# Patient Record
Sex: Female | Born: 1990 | Race: White | Hispanic: No | Marital: Married | Smoking: Never smoker
Health system: Southern US, Community
[De-identification: ages and names within clinical notes are randomized; demographics above are authoritative.]

## PROBLEM LIST (undated history)

## (undated) DIAGNOSIS — G43909 Migraine, unspecified, not intractable, without status migrainosus: Secondary | ICD-10-CM

## (undated) HISTORY — PX: TONSILLECTOMY: SUR1361

---
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
## 2013-11-06 DIAGNOSIS — G43809 Other migraine, not intractable, without status migrainosus: Secondary | ICD-10-CM | POA: Insufficient documentation

## 2018-07-03 DIAGNOSIS — H81393 Other peripheral vertigo, bilateral: Secondary | ICD-10-CM | POA: Insufficient documentation

## 2018-07-03 DIAGNOSIS — H6993 Unspecified Eustachian tube disorder, bilateral: Secondary | ICD-10-CM | POA: Insufficient documentation

## 2018-12-08 DIAGNOSIS — E66811 Obesity, class 1: Secondary | ICD-10-CM | POA: Insufficient documentation

## 2018-12-08 DIAGNOSIS — E6609 Other obesity due to excess calories: Secondary | ICD-10-CM | POA: Insufficient documentation

## 2018-12-08 DIAGNOSIS — H8113 Benign paroxysmal vertigo, bilateral: Secondary | ICD-10-CM | POA: Insufficient documentation

## 2019-07-12 ENCOUNTER — Ambulatory Visit: Payer: Self-pay | Attending: Internal Medicine

## 2019-07-12 DIAGNOSIS — Z23 Encounter for immunization: Secondary | ICD-10-CM

## 2019-07-12 NOTE — Progress Notes (Signed)
   Covid-19 Vaccination Clinic  Name:  Nadira Ambrosino    MRN: [id number] DOB: 1990-10-21  07/12/2019  Ms. Sheniqua Kohnen was observed post Covid-19 immunization for 15 minutes without incident. She was provided with Vaccine Information Sheet and instruction to access the V-Safe system.   Ms. Deshana Wiemer was instructed to call 911 with any severe reactions post vaccine: Marland Kitchen Difficulty breathing  . Swelling of face and throat  . A fast heartbeat  . A bad rash all over body  . Dizziness and weakness   Immunizations Administered    Name Date Dose VIS Date Route   Pfizer COVID-19 Vaccine 07/12/2019  8:44 AM 0.3 mL 04/06/2019 Intramuscular   Manufacturer: [place]   Lot: [id number]   [place]: [id number]

## 2019-08-07 ENCOUNTER — Ambulatory Visit: Payer: Self-pay | Attending: Internal Medicine

## 2019-08-07 DIAGNOSIS — Z23 Encounter for immunization: Secondary | ICD-10-CM

## 2019-08-07 NOTE — Progress Notes (Signed)
   Covid-19 Vaccination Clinic  Name:  Haely Hartness    MRN: [id number] DOB: Mar 31, 1991  08/07/2019  Ms. Jarrell Manzanares was observed post Covid-19 immunization for 15 minutes without incident. She was provided with Vaccine Information Sheet and instruction to access the V-Safe system.   Ms. Cora Phaup was instructed to call 911 with any severe reactions post vaccine: Marland Kitchen Difficulty breathing  . Swelling of face and throat  . A fast heartbeat  . A bad rash all over body  . Dizziness and weakness   Immunizations Administered    Name Date Dose VIS Date Route   Pfizer COVID-19 Vaccine 08/07/2019  8:29 AM 0.3 mL 04/06/2019 Intramuscular   Manufacturer: [place]   Lot: [phone] [id number]   [place]: [id number]

## 2020-09-24 DIAGNOSIS — K219 Gastro-esophageal reflux disease without esophagitis: Secondary | ICD-10-CM | POA: Insufficient documentation

## 2021-02-17 ENCOUNTER — Ambulatory Visit (INDEPENDENT_AMBULATORY_CARE_PROVIDER_SITE_OTHER)

## 2021-02-17 ENCOUNTER — Other Ambulatory Visit: Payer: Self-pay

## 2021-02-17 ENCOUNTER — Encounter: Payer: Self-pay | Admitting: Emergency Medicine

## 2021-02-17 ENCOUNTER — Ambulatory Visit
Admission: EM | Admit: 2021-02-17 | Discharge: 2021-02-17 | Disposition: A | Discharge status: Home / Self Care | Attending: Emergency Medicine | Admitting: Emergency Medicine

## 2021-02-17 DIAGNOSIS — J060 Acute laryngopharyngitis: Secondary | ICD-10-CM

## 2021-02-17 DIAGNOSIS — J029 Acute pharyngitis, unspecified: Secondary | ICD-10-CM | POA: Diagnosis not present

## 2021-02-17 HISTORY — DX: Migraine, unspecified, not intractable, without status migrainosus: G43.909

## 2021-02-17 LAB — POCT RAPID STREP A (OFFICE): Rapid Strep A Screen: NEGATIVE

## 2021-02-17 MED ORDER — PANTOPRAZOLE SODIUM 20 MG PO TBEC: 20.0000 mg | DELAYED_RELEASE_TABLET | Freq: Every day | ORAL | 0 refills | Status: DC

## 2021-02-17 MED ORDER — FLUTICASONE PROPIONATE 50 MCG/ACT NA SUSP: 2.0000 | Freq: Every day | NASAL | 0 refills | Status: DC

## 2021-02-17 MED ORDER — FAMOTIDINE 20 MG PO TABS: 20.0000 mg | ORAL_TABLET | Freq: Two times a day (BID) | ORAL | 0 refills | Status: DC

## 2021-02-17 NOTE — Discharge Instructions (Addendum)
Obvious postnasal drip.  I will contact you only if your x-ray is abnormal.  You can also call here and get the results in about an hour.  If it is abnormal, you will need to go to the emergency department.  In the meantime, Flonase for  Pepcid, Protonix for GERD.  Make sure you drink plenty of extra fluids.  Some people find salt water gargles and  Tr[employer] Medic[employer]'s "Throat Coat" tea helpful. Take 5 mL of liquid Benadryl and 5 mL of Maalox. Mix it together, and then hold it in your mouth for as long as you can and then swallow. You may do this 4 times a day.    Go to www.goodrx.com  or www.costplusdrugs.com to look up your medications. This will give you a list of where you can find your prescriptions at the most affordable prices. Or ask the pharmacist what the cash price is, or if they have any other discount programs available to help make your medication more affordable. This can be less expensive than what you would pay with insurance.

## 2021-02-17 NOTE — ED Triage Notes (Signed)
Home covid test negative.  Sore throat since last night.  States she does feel some drainage going down her throat.

## 2021-02-17 NOTE — ED Provider Notes (Signed)
HPI  SUBJECTIVE:  Patient reports severe upper sore throat starting last night.  Sx worse with breathing, swallowing, talking.  She has tried cough drops, tea, soup and Vicks.  The cough drops and tea are no longer helping.  She had a URI last week with a mild sore throat, most of the symptoms resolved except for the sore throat and laryngitis. No fever   No neck stiffness  No Cough No nasal congestion, rhinorrhea + Postnasal drip No Myalgias No new or different headache No Rash  No loss of taste or smell No shortness of breath or difficulty breathing No nausea, vomiting No diarrhea No abdominal pain     + reflux sxs-has been having issues with her GERD for the past several months, however this has not changed recently No Allergy sxs  + Raspy, but not muffled voice No Breathing difficulty, sensation of throat swelling shut No Drooling No Trismus No abx in past month.  No antipyretic in past 4-6 hrs  Past medical history of GERD, migraines, status post tonsillectomy. LMP: 10/14 denies the possibility being pregnant. [id number], Hinda Kehr    Past Medical History:  Diagnosis Date   Migraine     History reviewed. No pertinent surgical history.  History reviewed. No pertinent family history.  Social History   Tobacco Use   Smoking status: Never   Smokeless tobacco: Never  Vaping Use   Vaping Use: Never used  Substance Use Topics   Alcohol use: Not Currently   Drug use: Never    No current facility-administered medications for this encounter.  Current Outpatient Medications:    amitriptyline (ELAVIL) 10 MG tablet, Take 10 mg by mouth at bedtime., Disp: , Rfl:    famotidine (PEPCID) 20 MG tablet, Take 1 tablet (20 mg total) by mouth 2 (two) times daily., Disp: 40 tablet, Rfl: 0   fluticasone (FLONASE) 50 MCG/ACT nasal spray, Place 2 sprays into both nostrils daily., Disp: 16 g, Rfl: 0   pantoprazole (PROTONIX) 20 MG tablet, Take 1 tablet (20 mg total) by  mouth daily., Disp: 30 tablet, Rfl: 0   spironolactone (ALDACTONE) 25 MG tablet, Take 25 mg by mouth daily., Disp: , Rfl:    verapamil (CALAN) 80 MG tablet, Take 80 mg by mouth once., Disp: , Rfl:   No Known Allergies   ROS  As noted in HPI.   Physical Exam  BP 120/80 (BP Location: Right Arm)   Pulse 84   Temp 98.8 F (37.1 C) (Oral)   Resp 18   LMP 02/06/2021 (Exact Date)   SpO2 98%   Constitutional: Well developed, well nourished, no acute distress Eyes:  EOMI, conjunctiva normal bilaterally HENT: Normocephalic, atraumatic,mucus membranes moist. no nasal congestion +  erythematous oropharynx.  Tonsils surgically absent. Uvula midline. No postnasal drip, no drooling, trismus.  Raspy voice Respiratory: Normal inspiratory effort Cardiovascular: Normal rate, no murmurs, rubs, gallops GI: nondistended, nontender. No appreciable splenomegaly skin: No rash, skin intact Lymph: -  Anterior cervical LN.  No posterior cervical lymphadenopathy Musculoskeletal: no deformities Neurologic: Alert & oriented x 3, no focal neuro deficits Psychiatric: Speech and behavior appropriate.   ED Course   Medications - No data to display  Orders Placed This Encounter  Procedures   DG Neck Soft Tissue    Standing Status:   Standing    Number of Occurrences:   1    Order Specific Question:   Reason for Exam (SYMPTOM  OR DIAGNOSIS REQUIRED)    Answer:  Severe sore throat rule out epiglottitis   POCT rapid strep A    Standing Status:   Standing    Number of Occurrences:   1    Results for orders placed or performed during the hospital encounter of 02/17/21 (from the past 24 hour(s))  POCT rapid strep A     Status: None   Collection Time: 02/17/21 10:16 AM  Result Value Ref Range   Rapid Strep A Screen Negative Negative   DG Neck Soft Tissue  Result Date: 02/17/2021 CLINICAL DATA:  Severe sore throat question epiglottitis EXAM: NECK SOFT TISSUES - 1+ VIEW COMPARISON:  None FINDINGS:  Prevertebral soft tissues normal thickness. Epiglottis and aryepiglottic folds normal appearance. No regional soft tissue swelling identified. Visualized airway patent and lung apices clear. Osseous structures unremarkable. IMPRESSION: Normal exam. Electronically Signed   By: Lavonia Dana M.D.   On: 02/17/2021 11:28    ED Clinical Impression  1. Sore throat and laryngitis      ED Assessment/Plan   Rapid strep negative.  Checking soft tissue neck because recent URI and given the severity of the sore throat.  will contact patient at [phone] if abnormal, and I will instruct her to go to the ED.  In the meantime, Flonase, Benadryl/Maalox, will also start treating her GERD in case this is contributing to her sore throat.  Pepcid and Protonix.  No NSAIDs/Tylenol because she gets frequent migraines that occur with frequent medication use.  Reviewed imaging independently.  No evidence of epiglottitis.  Normal.  See radiology report for details.   Patient to followup with PMD when necessary.  Discussed imaging, MDM, plan and followup with patient. Discussed sn/sx that should prompt return to the ED. patient agrees with plan.   Meds ordered this encounter  Medications   famotidine (PEPCID) 20 MG tablet    Sig: Take 1 tablet (20 mg total) by mouth 2 (two) times daily.    Dispense:  40 tablet    Refill:  0   pantoprazole (PROTONIX) 20 MG tablet    Sig: Take 1 tablet (20 mg total) by mouth daily.    Dispense:  30 tablet    Refill:  0   fluticasone (FLONASE) 50 MCG/ACT nasal spray    Sig: Place 2 sprays into both nostrils daily.    Dispense:  16 g    Refill:  0     *This clinic note was created using [employer]. Therefore, there may be occasional mistakes despite careful proofreading.     Melynda Ripple, MD 02/18/21 [phone]

## 2021-03-18 ENCOUNTER — Ambulatory Visit: Payer: Self-pay

## 2021-06-18 ENCOUNTER — Ambulatory Visit
Admission: RE | Admit: 2021-06-18 | Discharge: 2021-06-18 | Disposition: A | Discharge status: Home / Self Care | Source: Ambulatory Visit | Attending: Urgent Care | Admitting: Urgent Care

## 2021-06-18 ENCOUNTER — Other Ambulatory Visit: Payer: Self-pay

## 2021-06-18 VITALS — BP 132/77 | HR 88 | Temp 98.3°F | Resp 18

## 2021-06-18 DIAGNOSIS — R5383 Other fatigue: Secondary | ICD-10-CM | POA: Diagnosis present

## 2021-06-18 DIAGNOSIS — R300 Dysuria: Secondary | ICD-10-CM | POA: Diagnosis present

## 2021-06-18 DIAGNOSIS — Z87440 Personal history of urinary (tract) infections: Secondary | ICD-10-CM | POA: Diagnosis present

## 2021-06-18 DIAGNOSIS — R5381 Other malaise: Secondary | ICD-10-CM

## 2021-06-18 DIAGNOSIS — R102 Pelvic and perineal pain: Secondary | ICD-10-CM

## 2021-06-18 DIAGNOSIS — D259 Leiomyoma of uterus, unspecified: Secondary | ICD-10-CM | POA: Diagnosis present

## 2021-06-18 LAB — POCT URINALYSIS DIP (MANUAL ENTRY)
Bilirubin, UA: NEGATIVE
Blood, UA: NEGATIVE
Glucose, UA: NEGATIVE mg/dL
Ketones, POC UA: NEGATIVE mg/dL
Leukocytes, UA: NEGATIVE
Nitrite, UA: NEGATIVE
Protein Ur, POC: NEGATIVE mg/dL
Spec Grav, UA: 1.01 (ref 1.010–1.025)
Urobilinogen, UA: 0.2 E.U./dL
pH, UA: 6 (ref 5.0–8.0)

## 2021-06-18 LAB — POCT URINE PREGNANCY: Preg Test, Ur: NEGATIVE

## 2021-06-18 MED ORDER — PHENAZOPYRIDINE HCL 200 MG PO TABS: 200.0000 mg | ORAL_TABLET | Freq: Three times a day (TID) | ORAL | 0 refills | Status: DC | PRN

## 2021-06-18 NOTE — ED Provider Notes (Signed)
[place]   MRN: [id number] DOB: October 24, 1990  Subjective:   Ashlee Grant is a 31 y.o. female presenting for 1 day history of malaise and fatigue, started having dysuria today. Took an urinalysis test otc and was positive for an UTI. Has had recurrent UTIs in the past year, has had ~3 in 1 year. Hydrates very well, has some coffee. Generally does not over do it but has had 4 shots of espresso today.  Has not seen a urologist.  She does have a gynecologist.  Reports that she has had a history of fibroids.  Denies fever, nausea, vomiting, urinary frequency, hematuria, vaginal discharge, genital rash.  Patient is in a monogamous relationship, low suspicion for STI but is not opposed to testing.  No current facility-administered medications for this encounter.  Current Outpatient Medications:    amitriptyline (ELAVIL) 10 MG tablet, Take 10 mg by mouth at bedtime., Disp: , Rfl:    famotidine (PEPCID) 20 MG tablet, Take 1 tablet (20 mg total) by mouth 2 (two) times daily., Disp: 40 tablet, Rfl: 0   fluticasone (FLONASE) 50 MCG/ACT nasal spray, Place 2 sprays into both nostrils daily., Disp: 16 g, Rfl: 0   pantoprazole (PROTONIX) 20 MG tablet, Take 1 tablet (20 mg total) by mouth daily., Disp: 30 tablet, Rfl: 0   spironolactone (ALDACTONE) 25 MG tablet, Take 25 mg by mouth daily., Disp: , Rfl:    verapamil (CALAN) 80 MG tablet, Take 80 mg by mouth once., Disp: , Rfl:    Allergies  Allergen Reactions   Effexor [Venlafaxine]     Past Medical History:  Diagnosis Date   Migraine      History reviewed. No pertinent surgical history.  History reviewed. No pertinent family history.  Social History   Tobacco Use   Smoking status: Never   Smokeless tobacco: Never  Vaping Use   Vaping Use: Never used  Substance Use Topics   Alcohol use: Not Currently   Drug use: Never    ROS   Objective:   Vitals: BP 132/77 (BP Location: Right Arm)    Pulse 88    Temp  98.3 F (36.8 C) (Oral)    Resp 18    LMP 05/30/2021 (Exact Date)    SpO2 98%   Physical Exam Constitutional:      General: She is not in acute distress.    Appearance: Normal appearance. She is well-developed. She is not ill-appearing, toxic-appearing or diaphoretic.  HENT:     Head: Normocephalic and atraumatic.     Nose: Nose normal.     Mouth/Throat:     Mouth: Mucous membranes are moist.  Eyes:     General: No scleral icterus.       Right eye: No discharge.        Left eye: No discharge.     Extraocular Movements: Extraocular movements intact.     Conjunctiva/sclera: Conjunctivae normal.  Cardiovascular:     Rate and Rhythm: Normal rate.  Pulmonary:     Effort: Pulmonary effort is normal.  Abdominal:     General: Bowel sounds are normal. There is no distension.     Palpations: Abdomen is soft. There is no mass.     Tenderness: There is abdominal tenderness (right pelvic). There is no right CVA tenderness, left CVA tenderness, guarding or rebound.  Skin:    General: Skin is warm and dry.  Neurological:     General: No focal deficit present.  Mental Status: She is alert and oriented to person, place, and time.  Psychiatric:        Mood and Affect: Mood normal.        Behavior: Behavior normal.        Thought Content: Thought content normal.        Judgment: Judgment normal.    Results for orders placed or performed during the hospital encounter of 06/18/21 (from the past 24 hour(s))  POCT urinalysis dipstick     Status: None   Collection Time: 06/18/21  5:13 PM  Result Value Ref Range   Color, UA yellow yellow   Clarity, UA clear clear   Glucose, UA negative negative mg/dL   Bilirubin, UA negative negative   Ketones, POC UA negative negative mg/dL   Spec Grav, UA 1.010 1.010 - 1.025   Blood, UA negative negative   pH, UA 6.0 5.0 - 8.0   Protein Ur, POC negative negative mg/dL   Urobilinogen, UA 0.2 0.2 or 1.0 E.U./dL   Nitrite, UA Negative Negative    Leukocytes, UA Negative Negative  POCT urine pregnancy     Status: None   Collection Time: 06/18/21  5:13 PM  Result Value Ref Range   Preg Test, Ur Negative Negative    Assessment and Plan :   PDMP not reviewed this encounter.  1. Dysuria   2. Malaise and fatigue   3. History of UTI   4. Uterine leiomyoma, unspecified location    Recommended aggressive hydration, limiting urinary irritants.  Urine culture pending, use supportive care including Pyridium.  Discussed the possibility that her pelvic pain may be related to the fibroid but patient prefers to avoid any particular pain treatments for now.  Plans on following up with her gynecologist.  For good measure, we are doing an STI check.  Low suspicion for PID, pyelonephritis, recurrent UTI, appendicitis.  Follow-up with PCP, consider referral to urology.  Counseled patient on potential for adverse effects with medications prescribed/recommended today, ER and return-to-clinic precautions discussed, patient verbalized understanding.    Jaynee Eagles, [place] 06/18/21 1736

## 2021-06-18 NOTE — Discharge Instructions (Addendum)
Make sure you hydrate very well with plain water and a quantity of 64 ounces of water a day.  Please limit drinks that are considered urinary irritants such as soda, sweet tea, coffee, energy drinks, alcohol.  These can worsen your urinary and genital symptoms but also be the source of them.  I will let you know about your urine culture and vaginal swab results through MyChart to see if we need to prescribe or change your antibiotics based off of those results.

## 2021-06-18 NOTE — ED Triage Notes (Signed)
Feels fatigued.  Pain on urination that started today.  Did at home urine test that tested positive.

## 2021-06-19 LAB — CERVICOVAGINAL ANCILLARY ONLY
Chlamydia: NEGATIVE
Comment: NEGATIVE
Comment: NEGATIVE
Comment: NORMAL
Neisseria Gonorrhea: NEGATIVE
Trichomonas: NEGATIVE

## 2021-06-20 LAB — URINE CULTURE: Culture: 70000 — AB

## 2021-06-25 ENCOUNTER — Telehealth (HOSPITAL_COMMUNITY): Payer: Self-pay | Admitting: Emergency Medicine

## 2021-06-25 MED ORDER — METRONIDAZOLE 500 MG PO TABS: 500.0000 mg | ORAL_TABLET | Freq: Two times a day (BID) | ORAL | 0 refills | Status: DC

## 2021-06-25 NOTE — Telephone Encounter (Signed)
Patient returned call and states she has noticed she is experiencing worsening urinary symptoms since her visit, reviewed previously with [facility], APP and okay'd for Metronidazole if symptomatic.  Metronidazole, per protocol, sent to pharmacy on file ?

## 2021-10-26 ENCOUNTER — Ambulatory Visit: Payer: Self-pay

## 2022-02-05 ENCOUNTER — Ambulatory Visit
Admission: RE | Admit: 2022-02-05 | Discharge: 2022-02-05 | Discharge status: Against Medical Advice | Payer: Self-pay | Source: Ambulatory Visit | Attending: Physician Assistant | Admitting: Physician Assistant

## 2022-02-05 NOTE — ED Notes (Signed)
Pt reports PCP had an opening for today and reported would rather be seen there. Pt left prior to triage.

## 2022-05-16 ENCOUNTER — Encounter: Payer: Self-pay | Admitting: Emergency Medicine

## 2022-05-16 ENCOUNTER — Ambulatory Visit
Admission: EM | Admit: 2022-05-16 | Discharge: 2022-05-16 | Disposition: A | Discharge status: Home / Self Care | Attending: Urgent Care | Admitting: Urgent Care

## 2022-05-16 DIAGNOSIS — Z23 Encounter for immunization: Secondary | ICD-10-CM

## 2022-05-16 DIAGNOSIS — S61211A Laceration without foreign body of left index finger without damage to nail, initial encounter: Secondary | ICD-10-CM | POA: Diagnosis not present

## 2022-05-16 DIAGNOSIS — M79645 Pain in left finger(s): Secondary | ICD-10-CM | POA: Diagnosis not present

## 2022-05-16 MED ORDER — TETANUS-DIPHTH-ACELL PERTUSSIS 5-2.5-18.5 LF-MCG/0.5 IM SUSY
0.5000 mL | PREFILLED_SYRINGE | Freq: Once | INTRAMUSCULAR | Status: AC
  Administered 2022-05-16: 0.5 mL via INTRAMUSCULAR

## 2022-05-16 NOTE — Discharge Instructions (Signed)

## 2022-05-16 NOTE — ED Triage Notes (Signed)
Cut left index finger while cutting bread this morning.  States she feels nauseated.  Unsure of when last tetanus shot was.  States she passed out after cutting her finger.

## 2022-05-16 NOTE — ED Provider Notes (Signed)
[facility]  Note:  This document was prepared using Dragon voice recognition software and may include unintentional dictation errors.  MRN: [id number] DOB: April 22, 1991  Subjective:   Ashlee Grant is a 32 y.o. female presenting for suffering a left index finger laceration while cutting bread this morning.  She came straight to our clinic.  Needs a Tdap update.  No loss of range of motion, sensation.  No current facility-administered medications for this encounter.  Current Outpatient Medications:    amitriptyline (ELAVIL) 10 MG tablet, Take 10 mg by mouth at bedtime., Disp: , Rfl:    famotidine (PEPCID) 20 MG tablet, Take 1 tablet (20 mg total) by mouth 2 (two) times daily., Disp: 40 tablet, Rfl: 0   fluticasone (FLONASE) 50 MCG/ACT nasal spray, Place 2 sprays into both nostrils daily., Disp: 16 g, Rfl: 0   metroNIDAZOLE (FLAGYL) 500 MG tablet, Take 1 tablet (500 mg total) by mouth 2 (two) times daily., Disp: 14 tablet, Rfl: 0   pantoprazole (PROTONIX) 20 MG tablet, Take 1 tablet (20 mg total) by mouth daily., Disp: 30 tablet, Rfl: 0   phenazopyridine (PYRIDIUM) 200 MG tablet, Take 1 tablet (200 mg total) by mouth 3 (three) times daily as needed for pain., Disp: 30 tablet, Rfl: 0   spironolactone (ALDACTONE) 25 MG tablet, Take 25 mg by mouth daily., Disp: , Rfl:    verapamil (CALAN) 80 MG tablet, Take 80 mg by mouth once., Disp: , Rfl:    Allergies  Allergen Reactions   Effexor [Venlafaxine]     Past Medical History:  Diagnosis Date   Migraine      Past Surgical History:  Procedure Laterality Date   TONSILLECTOMY      History reviewed. No pertinent family history.  Social History   Tobacco Use   Smoking status: Never   Smokeless tobacco: Never  Vaping Use   Vaping Use: Never used  Substance Use Topics   Alcohol use: Not Currently   Drug use: Never    ROS   Objective:   Vitals: BP (!) 91/53 (BP Location: Right Arm)   Pulse 73    Temp 98 F (36.7 C) (Oral)   Resp 18   LMP 05/02/2022 (Approximate)   SpO2 95%   Physical Exam Constitutional:      General: She is not in acute distress.    Appearance: Normal appearance. She is well-developed. She is not ill-appearing, toxic-appearing or diaphoretic.  HENT:     Head: Normocephalic and atraumatic.     Nose: Nose normal.     Mouth/Throat:     Mouth: Mucous membranes are moist.  Eyes:     General: No scleral icterus.       Right eye: No discharge.        Left eye: No discharge.     Extraocular Movements: Extraocular movements intact.  Cardiovascular:     Rate and Rhythm: Normal rate.  Pulmonary:     Effort: Pulmonary effort is normal.  Musculoskeletal:       Hands:  Skin:    General: Skin is warm and dry.  Neurological:     General: No focal deficit present.     Mental Status: She is alert and oriented to person, place, and time.  Psychiatric:        Mood and Affect: Mood normal.        Behavior: Behavior normal.     PROCEDURE NOTE: laceration repair Verbal consent obtained from patient.  Local anesthesia with 1cc Lidocaine 2% without epinephrine.  Wound explored for tendon, ligament damage. Wound scrubbed with soap and water and rinsed. Wound closed with #2 5-0 Ethilon (simple interrupted) sutures.  Wound cleansed and dressed.   Assessment and Plan :   PDMP not reviewed this encounter.  1. Finger pain, left   2. Laceration of left index finger without foreign body without damage to nail, initial encounter   3. Need for diphtheria-tetanus-pertussis (Tdap) vaccine     Laceration repaired successfully. Wound care reviewed. Recommended Tylenol and/or ibuprofen for pain control. Return-to-clinic precautions discussed, patient verbalized understanding. Otherwise, follow up in 10 days for suture removal. Counseled patient on potential for adverse effects with medications prescribed/recommended today, ER and return-to-clinic precautions discussed, patient  verbalized understanding.    Jaynee Eagles, [place] 05/16/22 [phone]

## 2022-05-26 ENCOUNTER — Ambulatory Visit
Admission: RE | Admit: 2022-05-26 | Discharge: 2022-05-26 | Disposition: A | Discharge status: Home / Self Care | Source: Ambulatory Visit | Attending: Physician Assistant | Admitting: Physician Assistant

## 2022-05-26 DIAGNOSIS — S61211A Laceration without foreign body of left index finger without damage to nail, initial encounter: Secondary | ICD-10-CM | POA: Diagnosis not present

## 2022-05-26 NOTE — ED Triage Notes (Signed)
Pt presents for suture removal in left index finger.

## 2023-03-07 IMAGING — DX DG NECK SOFT TISSUE
2 series · 2 of 2 positions shown · non-contrast
Comparison: None

CLINICAL DATA: Severe sore throat question epiglottitis

EXAM:
NECK SOFT TISSUES - 1+ VIEW

[soft tissue neck ap]
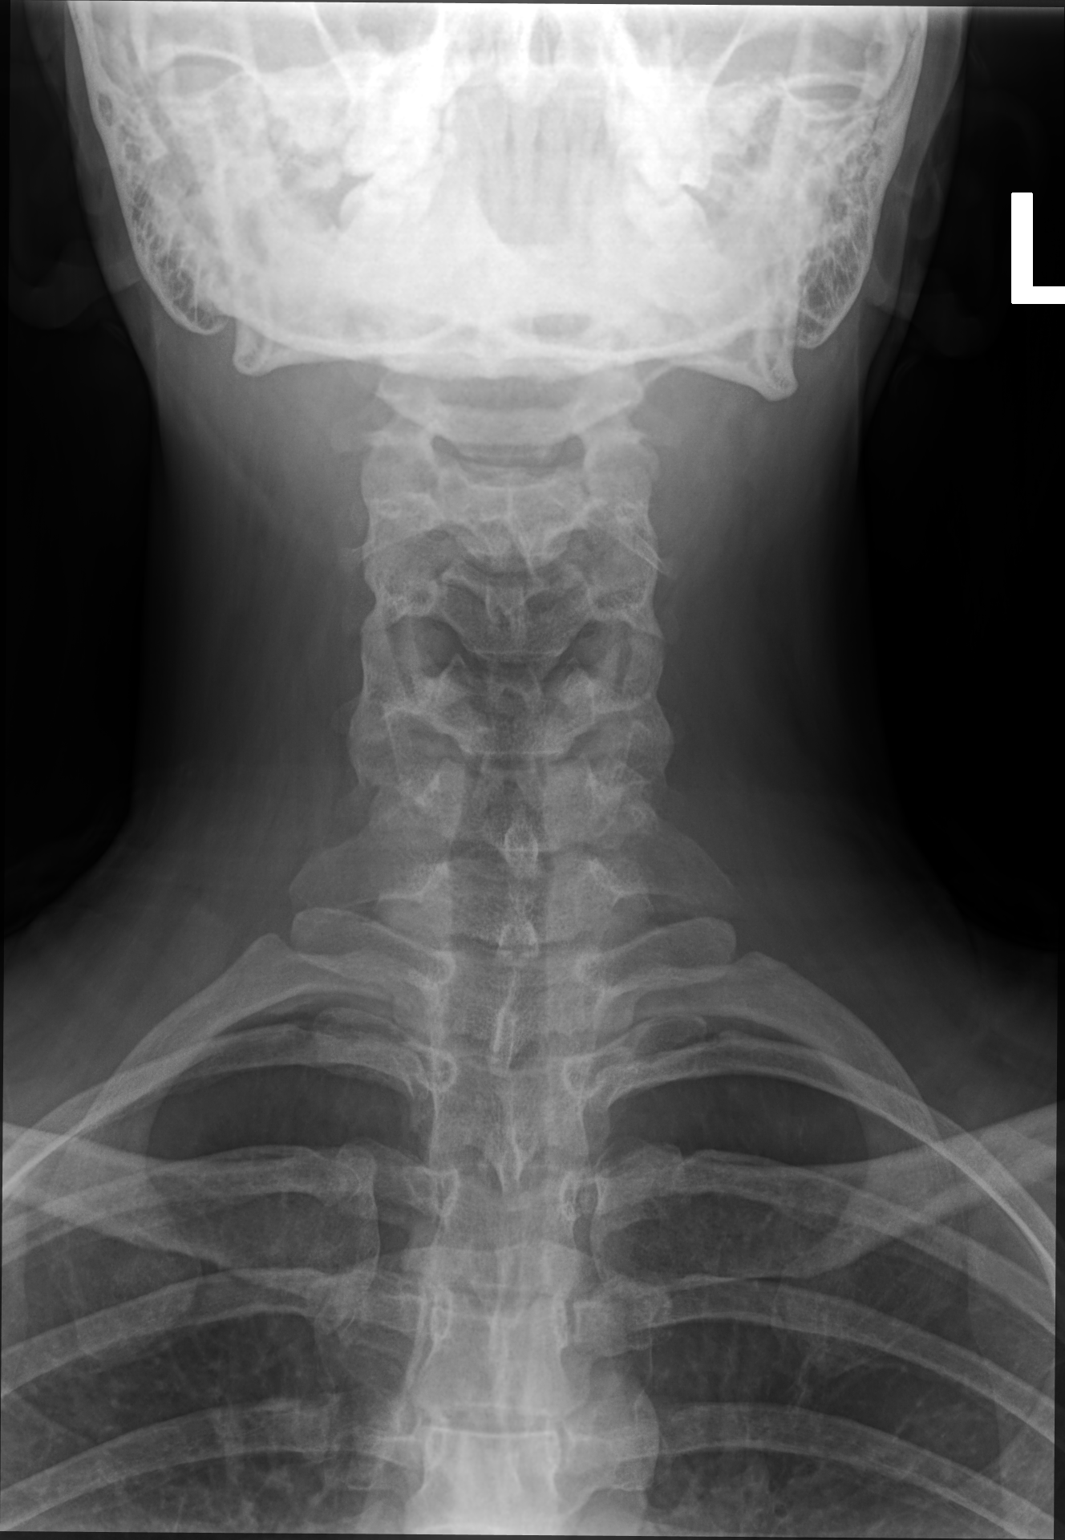

[soft tissue neck lat]
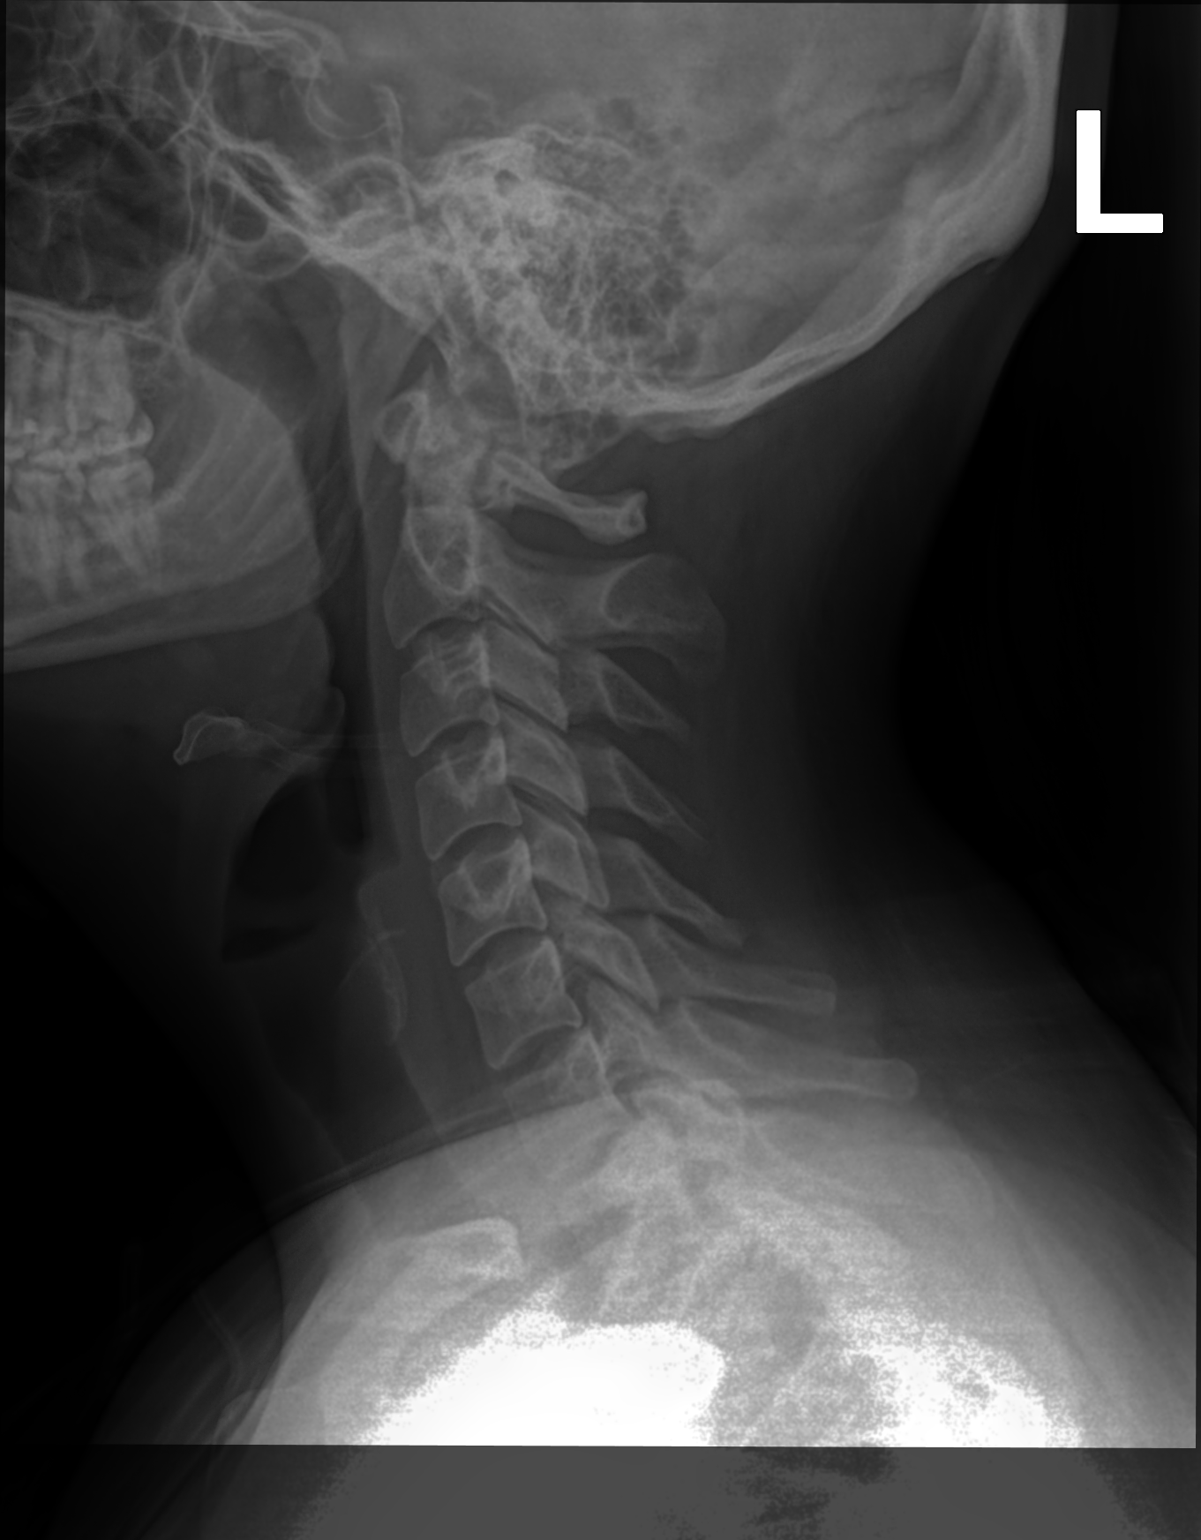

[2 of 2 positions shown; findings below may reference images not displayed]

FINDINGS: Prevertebral soft tissues normal thickness.

Epiglottis and aryepiglottic folds normal appearance.

No regional soft tissue swelling identified.

Visualized airway patent and lung apices clear.

Osseous structures unremarkable.
IMPRESSION: Normal exam.

## 2023-04-07 ENCOUNTER — Encounter: Payer: Self-pay | Admitting: Neurology

## 2023-04-07 ENCOUNTER — Ambulatory Visit: Admitting: Neurology

## 2023-04-07 VITALS — BP 108/68 | HR 76 | Ht 63.0 in | Wt 173.0 lb

## 2023-04-07 DIAGNOSIS — G43709 Chronic migraine without aura, not intractable, without status migrainosus: Secondary | ICD-10-CM | POA: Diagnosis not present

## 2023-04-07 NOTE — Patient Instructions (Addendum)
Botox approval - with Dr. Lucia Gaskins, we will you approved and call for injection

## 2023-04-07 NOTE — Progress Notes (Signed)
[facility]    Provider:  Dr Lucia Gaskins Requesting Provider: Jordan Hawks, PA-C Primary Care Provider:  Jordan Hawks, PA-C  CC:    HPI:  Ashlee Grant is a 32 y.o. female here as requested by Jordan Hawks, PA-C for headaches.  I reviewed charts from [facility].  She was diagnosed with vestibular migraine, class I obesity 31-31.9 in adult, past medical history/problem list includes eustachian tube dysfunction, benign paroxysmal positional vertigo, obesity.,  Migraine, headache, palpitations.  Notes are from [place], [place].  Maralyn Sago reported that in October 2024 patient was currently trying to wean off migraine preventative therapy due to trying to become pregnant in the near future.  They prescribed a prednisone taper due to rebound headache.  They started weaning her off verapamil slowly, prescribe Zofran as needed for nausea, they also ordered blood work to further evaluate patient's symptoms.  And they referred her to headache specialist.  By report she was seen for daily migraines since stopping her preventative migraine medications currently wanting to become pregnant in the future.  She also reported generalized fatigue.  Physical neurologic exam was normal. She has an aura bright lights and "squiggles" according to Dr. Janey Greaser notes from 07/08/2021. 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 Bascom Levels stated her symptoms not consistent with that diagnosis. GYN stopped her OCP as she has auras.  She has been on multiple medications. She has daily headaches and 12 of those are migraines;  migraines are at least 12 a month that are moderate to severe without aura that can last 8-24 hours, she has pulsating/pounding/throbbing, no medication overuse, nausea, photophobia/phonophobia/osmophobia, nausea. No vomiting and it affects the quality of life.  Discussed options in depth. Reviewed cgrps, older tradiional medications an dbotox pros and cons   From  a thorough review of records, medications tried for migraine and headache management greater than 3 months includes: Amitriptyline, Voltaren, hydroxyzine, lasmiditan hand, Zofran, Ubrelvy, verapamil, diclofenac, Zofran, prednisone, ajovy, triptans contraindicated due to brainstem aura in some of her migraines, ajovy, emgality, aimovig(had constipation) and the following  Dr. Bascom Levels: ANALGESICS: Tylenol, Excedrin ANTI-MIGRAINE: no triptans for now- 1 possible hemiplegic migraine, Ubrelvy- effective, HEART/BP: Verapamil DECONGESTANT/ANTIHISTAMINE: Meclizine, Flonase, hydroxyzine ANTI-NAUSEANT: NSAIDS: Toradol, Advil, diclofenac MUSCLE RELAXANTS: Baclofen-nausea, chlorzoxazone ANTI-CONVULSANTS: Topamax (severe dry eyes) STEROIDS: Prednisone  SLEEPING PILLS/TRANQUILIZERS: ANTI-DEPRESSANTS: Effexor tongue numb/ panic attack, Amitriptyline effective, imipramine- ineffective HERBAL:  FIBROMYALGIA:  HORMONAL: OTHER: Emgality, Ajovy- effective, Aimovig- constipation PROCEDURES FOR HEADACHES: TPI- vasovagal rx, SPG   Reviewed notes, labs and imaging from outside physicians, which showed: see above  01/25/2023: THYROID  [facility]10/04/2022 Component 01/25/2023 03/26/2022 09/05/2020         TSH 0.640 0.873 1.280 Load older lab results   03/26/2022: CHEM PROFILE  [facility]12/04/2021 Component 03/26/2022 03/18/2021 01/13/2021 09/05/2020          Sodium 140 140 -- 139 Load older lab results  Potassium 4.4 4.7 4.4 4.5 Load older lab results  Chloride 103 101 -- 101 Load older lab results  Carbon Dioxide (CO2) -- -- -- -- Load older lab results  Anion Gap -- -- -- -- Load older lab results  Glucose 86 76 -- 84 Load older lab results  BUN 11 11 -- 12 Load older lab results  Creatinine 0.72 0.75 -- 0.78 Load older lab results  Calcium -- -- -- -- Load older lab results  Alkaline Phosphatase 58 72 -- 64 Load older lab results  Total Bilirubin <0.2 0.3 --  0.2 Load older lab results   Total Protein 7.0 6.9 -- 7.2 Load older lab results  Albumin, Serum 4.8 4.7 -- 4.6 Load older lab results  ALT (SGPT) 33 High    30 -- 24 Load older lab results  SGOT (AST) -- -- -- -- Load older lab results  eGFR If NonAfrican American -- -- -- -- Load older lab results  eGFR If African American -- -- -- -- Load older lab results  BUN/Creatinine Ratio 15 15 -- 15 Load older lab results  CO2 24 22 -- 22 Load older lab results  Albumin -- -- -- -- Load older lab results  Globulin, Total 2.2 2.2 -- 2.6 Load older lab results  Albumin/Globulin Ratio 2.2 2.1 -- 1.8 Load older lab results  AST 24 23 -- 22 Load older lab results  CALCIUM 9.8 9.9 -- 9.5 Load older lab results  Na -- -- -- -- Load older lab results  Cl -- -- -- -- Load older lab results  Ca -- -- -- -- Load older lab results  ALK PHOS -- -- -- -- Load older lab results  T Bili -- -- -- -- Load older lab results  Alb -- -- -- -- Load older lab results  GLOBULIN -- -- -- -- Load older lab results  ALBUMIN/GLOBULIN RATIO -- -- -- -- Load older lab results  BUN/CREAT RATIO -- -- -- -- Load older lab results  ALT -- -- -- -- Load older lab results  GFR AFRICAN AMERICAN -- -- -- -- Load older lab results  GFR Non African American -- -- -- -- Load older lab results  AGAP -- -- -- -- Load older lab results  Mg -- -- -- -- Load older lab results  eGFR 116 111 -- 107    06/03/2018: Impression  IMPRESSION: 1.  No acute intracranial abnormality.  Electronically Signed by: Angie Fava Narrative  INDICATION: new continuous vertigo, migraines.  COMPARISON: None  TECHNIQUE: Multiplanar, multisequence MR imaging of the brain was obtained without IV contrast.  FINDINGS: #  Skull/marrow/soft tissues: Unremarkable. #  Orbits: Unremarkable. #  Sinuses: Unremarkable. #  Brain: No evidence of acute abnormality.  No significant white matter disease or acute ischemia.  No mass effect, hemorrhage, or hydrocephalus.  The CP angle  cisterns, internal auditory canals and inner ears structures appear grossly normal on this unenhanced study. No brainstem or cerebellar lesion. Grossly normal flow-related signal in the major intracranial arteries and dural sinuses..  #  Additional comments: None. Other Result Text  Acute Interface, Incoming Rad Results - 06/13/2018 10:07 AM EST INDICATION: new continuous vertigo, migraines.  COMPARISON: None  TECHNIQUE: Multiplanar, multisequence MR imaging of the brain was obtained without IV contrast.  FINDINGS: #  Skull/marrow/soft tissues: Unremarkable. #  Orbits: Unremarkable. #  Sinuses: Unremarkable. #  Brain: No evidence of acute abnormality.  No significant white matter disease or acute ischemia.  No mass effect, hemorrhage, or hydrocephalus.  The CP angle cisterns, internal auditory canals and inner ears structures appear grossly normal on this unenhanced study. No brainstem or cerebellar lesion. Grossly normal flow-related signal in the major intracranial arteries and dural sinuses..  #  Additional comments: None.   IMPRESSION: 1.  No acute intracranial abnormality.  Review of Systems: Patient complains of symptoms per HPI as well as the following symptoms none. Pertinent negatives and positives per HPI. All others negative.   Social History   Socioeconomic History   Marital status: Married  Spouse name: Not on file   Number of children: Not on file   Years of education: Not on file   Highest education level: Not on file  Occupational History   Not on file  Tobacco Use   Smoking status: Never   Smokeless tobacco: Never  Vaping Use   Vaping status: Never Used  Substance and Sexual Activity   Alcohol use: Not Currently   Drug use: Never   Sexual activity: Yes  Other Topics Concern   Not on file  Social History Narrative   Not on file   Social Drivers of Health   Financial Resource Strain: Low Risk  (12/01/2022)   Received from [facility]    Overall Financial Resource Strain (CARDIA)    Difficulty of Paying Living Expenses: Not hard at all  Food Insecurity: No Food Insecurity (12/01/2022)   Received from [facility]   Hunger Vital Sign    Worried About Running Out of Food in the Last Year: Never true    Ran Out of Food in the Last Year: Never true  Transportation Needs: No Transportation Needs (12/01/2022)   Received from [facility] - Transportation    Lack of Transportation (Medical): No    Lack of Transportation (Non-Medical): No  Physical Activity: Insufficiently Active (12/01/2022)   Received from [facility]   Exercise Vital Sign    Days of Exercise per Week: 2 days    Minutes of Exercise per Session: 20 min  Stress: No Stress Concern Present (12/01/2022)   Received from [facility] - Occupational Stress Questionnaire    Feeling of Stress : Only a little  Social Connections: Moderately Integrated (12/01/2022)   Received from [facility]   Social Network    How would you rate your social network (family, work, friends)?: Adequate participation with social networks  Intimate Partner Violence: Not At Risk (12/01/2022)   Received from [facility]   HITS    Over the last 12 months how often did your partner physically hurt you?: Never    Over the last 12 months how often did your partner insult you or talk down to you?: Never    Over the last 12 months how often did your partner threaten you with physical harm?: Never    Over the last 12 months how often did your partner scream or curse at you?: Never    Family History  Problem Relation Age of Onset   Seizures Mother    Stroke Paternal Aunt    Stroke Paternal Uncle    Stroke Maternal Grandmother    Stroke Maternal Grandfather    Migraines Neg Hx     Past Medical History:  Diagnosis Date   Migraine     Patient Active Problem List   Diagnosis Date Noted   Chronic migraine without aura without status  migrainosus, not intractable 04/10/2023    Past Surgical History:  Procedure Laterality Date   TONSILLECTOMY      Current Outpatient Medications  Medication Sig Dispense Refill   Magnesium Oxide 250 MG TABS Take by mouth.     Multiple Vitamin (MULTIVITAMIN) capsule Take 1 capsule by mouth daily.     spironolactone (ALDACTONE) 25 MG tablet Take 25 mg by mouth daily.     Ubrogepant (UBRELVY) 50 MG TABS Take by mouth.     verapamil (CALAN) 40 MG tablet Take 40 mg by mouth 3 (three) times daily.  No current facility-administered medications for this visit.    Allergies as of 04/07/2023 - Review Complete 04/07/2023  Allergen Reaction Noted   Effexor [venlafaxine]  06/18/2021    Vitals: BP 108/68 (Cuff Size: Normal)   Pulse 76   Ht 5\' 3"  (1.6 m)   Wt 173 lb (78.5 kg)   BMI 30.65 kg/m  Last Weight:  Wt Readings from Last 1 Encounters:  04/07/23 173 lb (78.5 kg)   Last Height:   Ht Readings from Last 1 Encounters:  04/07/23 5\' 3"  (1.6 m)     Physical exam: Exam: Gen: NAD, conversant, well nourised, obese, well groomed                     CV: RRR, no MRG. No Carotid Bruits. No peripheral edema, warm, nontender Eyes: Conjunctivae clear without exudates or hemorrhage  Neuro: Detailed Neurologic Exam  Speech:    Speech is normal; fluent and spontaneous with normal comprehension.  Cognition:    The patient is oriented to person, place, and time;     recent and remote memory intact;     language fluent;     normal attention, concentration,     fund of knowledge Cranial Nerves:    The pupils are equal, round, and reactive to light. The fundi are normal and spontaneous venous pulsations are present. Visual fields are full to finger confrontation. Extraocular movements are intact. Trigeminal sensation is intact and the muscles of mastication are normal. The face is symmetric. The palate elevates in the midline. Hearing intact. Voice is normal. Shoulder shrug is normal.  The tongue has normal motion without fasciculations.   Coordination: nml  Gait:    Heel-toe and tandem gait are normal.   Motor Observation: nml Tone:    Normal muscle tone.    Posture:    Posture is normal. normal erect    Strength:    Strength is V/V in the upper and lower limbs.      Sensation: intact to LT     Reflex Exam:  DTR's:    Deep tendon reflexes in the upper and lower extremities are normal bilaterally.   Toes:    The toes are downgoing bilaterally.   Clonus:    Clonus is absent.    Assessment/Plan:  She has been on multiple medications. She has daily headaches and 12 of those are migraines;  migraines are at least 12 a month that are moderate to severe without aura that can last 8-24 hours, she has pulsating/pounding/throbbing, no medication overuse, nausea, photophobia/phonophobia/osmophobia, nausea. No vomiting and it affects the quality of life.  Discussed options in depth. Reviewed cgrps, older tradiional medications an dbotox pros and cons  To prevent or relieve headaches, try the following: Cool Compress. Lie down and place a cool compress on your head.  Avoid headache triggers. If certain foods or odors seem to have triggered your migraines in the past, avoid them. A headache diary might help you identify triggers.  Include physical activity in your daily routine. Try a daily walk or other moderate aerobic exercise.  Manage stress. Find healthy ways to cope with the stressors, such as delegating tasks on your to-do list.  Practice relaxation techniques. Try deep breathing, yoga, massage and visualization.  Eat regularly. Eating regularly scheduled meals and maintaining a healthy diet might help prevent headaches. Also, drink plenty of fluids.  Follow a regular sleep schedule. Sleep deprivation might contribute to headaches Consider biofeedback. With this mind-body technique, you  learn to control certain bodily functions -- such as muscle tension, heart  rate and blood pressure -- to prevent headaches or reduce headache pain.    Proceed to emergency room if you experience new or worsening symptoms or symptoms do not resolve, if you have new neurologic symptoms or if headache is severe, or for any concerning symptom.   Provided education and documentation from American headache Society toolbox including articles on: chronic migraine medication overuse headache, chronic migraines, prevention of migraines, behavioral and other nonpharmacologic treatments for headache.  Cc: Alvira Monday, PA-C  Naomie Dean, MD  [facility] [address] [place], [place] [address]  Phone [phone] Fax [phone]

## 2023-04-10 ENCOUNTER — Encounter: Payer: Self-pay | Admitting: Neurology

## 2023-04-10 ENCOUNTER — Telehealth: Payer: Self-pay | Admitting: Neurology

## 2023-04-10 DIAGNOSIS — G43709 Chronic migraine without aura, not intractable, without status migrainosus: Secondary | ICD-10-CM

## 2023-04-10 NOTE — Telephone Encounter (Signed)
Please start Botox protocol, g43.709.  Jillian when she gets approved I can squeeze her into my schedule and then transition her to an NP let me know and I can try to find a spot to squeeze her in.

## 2023-04-11 NOTE — Telephone Encounter (Signed)
Submitted benefit verification, will update once results are received. [id number]

## 2023-04-11 NOTE — Telephone Encounter (Signed)
Chronic Migraine CPT 64615  Botox J0585 Units:200  G43.709 Chronic Migraine without aura, not intractable, without status migrainous   

## 2023-04-12 NOTE — Telephone Encounter (Signed)
Submitted auth request via [employer] portal, status is pending.

## 2023-04-18 NOTE — Telephone Encounter (Signed)
Received approval from [employer], pt will be buy/bill.  Auth# [id number] (04/06/23-04/05/24)

## 2023-04-25 NOTE — Telephone Encounter (Signed)
December 31st or jan 7th 730am? Tell her I have to squeeze he rin so that's why it is so early.

## 2023-04-25 NOTE — Telephone Encounter (Signed)
LVM and sent MyChart msg asking pt to call back and schedule appt.

## 2023-05-03 NOTE — Telephone Encounter (Signed)
 I called pt again, we scheduled appt for 05/26/23 @ 9:00 am. I offered sooner appt but this was the first that worked for pt.

## 2023-05-18 ENCOUNTER — Telehealth: Payer: Self-pay | Admitting: Neurology

## 2023-05-18 NOTE — Telephone Encounter (Signed)
Pt requested to reschedule appointment due to would like someone with her in case she passes out. Pt said I sometimes pass out when getting injections.

## 2023-05-26 ENCOUNTER — Ambulatory Visit: Admitting: Neurology

## 2023-06-21 ENCOUNTER — Ambulatory Visit: Admitting: Neurology

## 2023-06-21 ENCOUNTER — Encounter: Payer: Self-pay | Admitting: Neurology

## 2023-06-24 ENCOUNTER — Other Ambulatory Visit: Payer: Self-pay | Admitting: Neurology

## 2023-06-24 DIAGNOSIS — G43009 Migraine without aura, not intractable, without status migrainosus: Secondary | ICD-10-CM

## 2023-06-24 MED ORDER — UBRELVY 50 MG PO TABS: 50.0000 mg | ORAL_TABLET | ORAL | 11 refills | Status: DC | PRN

## 2023-07-04 ENCOUNTER — Ambulatory Visit (INDEPENDENT_AMBULATORY_CARE_PROVIDER_SITE_OTHER): Admitting: Neurology

## 2023-07-04 DIAGNOSIS — G43709 Chronic migraine without aura, not intractable, without status migrainosus: Secondary | ICD-10-CM | POA: Diagnosis not present

## 2023-07-04 DIAGNOSIS — G43009 Migraine without aura, not intractable, without status migrainosus: Secondary | ICD-10-CM

## 2023-07-04 MED ORDER — ONABOTULINUMTOXINA 200 UNITS IJ SOLR
155.0000 [IU] | Freq: Once | INTRAMUSCULAR | Status: AC
  Administered 2023-07-04: 155 [IU] via INTRAMUSCULAR

## 2023-07-04 NOTE — Progress Notes (Signed)
 Botox- 200 units x 1 vial Lot: [id number] Expiration: 07/2025 NDC: 2956-2130-86  Bacteriostatic 0.9% Sodium Chloride- * mL  Lot: [id number] Expiration: 02/25/2024 NDC: 6295-2841-32  Dx: G40.102 B/B Witnessed by Alverda Skeans, RN

## 2023-07-04 NOTE — Progress Notes (Signed)
 Consent Form Botulism Toxin Injection For Chronic Migraine  07/04/2023; first botox.     Reviewed orally with patient, additionally signature is on file:  Botulism toxin has been approved by the Federal drug administration for treatment of chronic migraine. Botulism toxin does not cure chronic migraine and it may not be effective in some patients.  The administration of botulism toxin is accomplished by injecting a small amount of toxin into the muscles of the neck and head. Dosage must be titrated for each individual. Any benefits resulting from botulism toxin tend to wear off after 3 months with a repeat injection required if benefit is to be maintained. Injections are usually done every 3-4 months with maximum effect peak achieved by about 2 or 3 weeks. Botulism toxin is expensive and you should be sure of what costs you will incur resulting from the injection.  The side effects of botulism toxin use for chronic migraine may include:   -Transient, and usually mild, facial weakness with facial injections  -Transient, and usually mild, head or neck weakness with head/neck injections  -Reduction or loss of forehead facial animation due to forehead muscle weakness  -Eyelid drooping  -Dry eye  -Pain at the site of injection or bruising at the site of injection  -Double vision  -Potential unknown long term risks  Contraindications: You should not have Botox if you are pregnant, nursing, allergic to albumin, have an infection, skin condition, or muscle weakness at the site of the injection, or have myasthenia gravis, Lambert-Eaton syndrome, or ALS.  It is also possible that as with any injection, there may be an allergic reaction or no effect from the medication. Reduced effectiveness after repeated injections is sometimes seen and rarely infection at the injection site may occur. All care will be taken to prevent these side effects. If therapy is given over a long time, atrophy and wasting in  the muscle injected may occur. Occasionally the patient's become refractory to treatment because they develop antibodies to the toxin. In this event, therapy needs to be modified.  I have read the above information and consent to the administration of botulism toxin.    BOTOX PROCEDURE NOTE FOR MIGRAINE HEADACHE    Contraindications and precautions discussed with patient(above). Aseptic procedure was observed and patient tolerated procedure. Procedure performed by Dr. Artemio Aly  The condition has existed for more than 6 months, and pt does not have a diagnosis of ALS, Myasthenia Gravis or Lambert-Eaton Syndrome.  Risks and benefits of injections discussed and pt agrees to proceed with the procedure.  Written consent obtained  These injections are medically necessary. Pt  receives good benefits from these injections. These injections do not cause sedations or hallucinations which the oral therapies may cause.  Description of procedure:  The patient was placed in a sitting position. The standard protocol was used for Botox as follows, with 5 units of Botox injected at each site:   -Procerus muscle, midline injection  -Corrugator muscle, bilateral injection  -Frontalis muscle, bilateral injection, with 2 sites each side, medial injection was performed in the upper one third of the frontalis muscle, in the region vertical from the medial inferior edge of the superior orbital rim. The lateral injection was again in the upper one third of the forehead vertically above the lateral limbus of the cornea, 1.5 cm lateral to the medial injection site.  -Temporalis muscle injection, 4 sites, bilaterally. The first injection was 3 cm above the tragus of the ear, second injection site  was 1.5 cm to 3 cm up from the first injection site in line with the tragus of the ear. The third injection site was 1.5-3 cm forward between the first 2 injection sites. The fourth injection site was 1.5 cm posterior to  the second injection site.   -Occipitalis muscle injection, 3 sites, bilaterally. The first injection was done one half way between the occipital protuberance and the tip of the mastoid process behind the ear. The second injection site was done lateral and superior to the first, 1 fingerbreadth from the first injection. The third injection site was 1 fingerbreadth superiorly and medially from the first injection site.  -Cervical paraspinal muscle injection, 2 sites, bilateral knee first injection site was 1 cm from the midline of the cervical spine, 3 cm inferior to the lower border of the occipital protuberance. The second injection site was 1.5 cm superiorly and laterally to the first injection site.  -Trapezius muscle injection was performed at 3 sites, bilaterally. The first injection site was in the upper trapezius muscle halfway between the inflection point of the neck, and the acromion. The second injection site was one half way between the acromion and the first injection site. The third injection was done between the first injection site and the inflection point of the neck.   Will return for repeat injection in 3 months.   200 units of Botox was used, any Botox not injected was wasted. The patient tolerated the procedure well, there were no complications of the above procedure.

## 2023-07-25 ENCOUNTER — Ambulatory Visit: Payer: Self-pay

## 2023-08-30 ENCOUNTER — Ambulatory Visit (INDEPENDENT_AMBULATORY_CARE_PROVIDER_SITE_OTHER)

## 2023-08-30 ENCOUNTER — Ambulatory Visit
Admission: RE | Admit: 2023-08-30 | Discharge: 2023-08-30 | Disposition: A | Discharge status: Home / Self Care | Source: Ambulatory Visit | Attending: Nurse Practitioner | Admitting: Nurse Practitioner

## 2023-08-30 VITALS — BP 119/80 | HR 87 | Temp 98.0°F | Resp 18

## 2023-08-30 DIAGNOSIS — S92515A Nondisplaced fracture of proximal phalanx of left lesser toe(s), initial encounter for closed fracture: Secondary | ICD-10-CM

## 2023-08-30 NOTE — ED Provider Notes (Signed)
 [facility]    CSN: [id number] Arrival date & time: 08/30/23  2956      History   Chief Complaint Chief Complaint  Patient presents with   Toe Injury    Hit toe and it's black, I can hardly walk. I think I broke my toe. - Entered by patient    HPI Ashlee Grant is a 33 y.o. female.   The history is provided by the patient.   Patient presents for complaints of pain and swelling to the 4th and 5th toes of the left foot.  Patient states that she injured the left foot on furniture in her home last evening.  She complains of pain with ambulation, and when she moves her foot up or down or tries to spread her toes open.  Patient also complains of bruising to the left foot.  She denies numbness, tingling, radiation of pain, or the inability to bear weight.   Past Medical History:  Diagnosis Date   Migraine     Patient Active Problem List   Diagnosis Date Noted   Chronic migraine without aura without status migrainosus, not intractable 04/10/2023    Past Surgical History:  Procedure Laterality Date   TONSILLECTOMY      OB History   No obstetric history on file.      Home Medications    Prior to Admission medications   Medication Sig Start Date End Date Taking? Authorizing Provider  Magnesium Oxide 250 MG TABS Take by mouth.    [provider]  Multiple Vitamin (MULTIVITAMIN) capsule Take 1 capsule by mouth daily.    [provider]  spironolactone (ALDACTONE) 25 MG tablet Take 25 mg by mouth daily.    [provider]  Ubrogepant  (UBRELVY ) 50 MG TABS Take 1 tablet (50 mg total) by mouth every 2 (two) hours as needed. Max twice per day. 06/24/23   Glory Larsen, MD  verapamil (CALAN) 40 MG tablet Take 40 mg by mouth 3 (three) times daily.    [provider]    Family History Family History  Problem Relation Age of Onset   Seizures Mother    Stroke Paternal Aunt    Stroke Paternal Uncle    Stroke Maternal  Grandmother    Stroke Maternal Grandfather    Migraines Neg Hx     Social History Social History   Tobacco Use   Smoking status: Never   Smokeless tobacco: Never  Vaping Use   Vaping status: Never Used  Substance Use Topics   Alcohol use: Not Currently   Drug use: Never     Allergies   Effexor [venlafaxine]   Review of Systems Review of Systems Per HPI  Physical Exam Triage Vital Signs ED Triage Vitals  Encounter Vitals Group     BP 08/30/23 0841 119/80     Systolic BP Percentile --      Diastolic BP Percentile --      Pulse Rate 08/30/23 0841 87     Resp 08/30/23 0841 18     Temp 08/30/23 0841 98 F (36.7 C)     Temp Source 08/30/23 0841 Oral     SpO2 08/30/23 0841 98 %     Weight --      Height --      Head Circumference --      Peak Flow --      Pain Score 08/30/23 0842 3     Pain Loc --  Pain Education --      Exclude from Growth Chart --    No data found.  Updated Vital Signs BP 119/80 (BP Location: Right Arm)   Pulse 87   Temp 98 F (36.7 C) (Oral)   Resp 18   LMP 08/29/2023 (Exact Date)   SpO2 98%   Visual Acuity Right Eye Distance:   Left Eye Distance:   Bilateral Distance:    Right Eye Near:   Left Eye Near:    Bilateral Near:     Physical Exam Vitals and nursing note reviewed.  Constitutional:      General: She is not in acute distress.    Appearance: Normal appearance.  HENT:     Head: Normocephalic.  Eyes:     Extraocular Movements: Extraocular movements intact.     Pupils: Pupils are equal, round, and reactive to light.  Pulmonary:     Effort: Pulmonary effort is normal.  Musculoskeletal:     Cervical back: Normal range of motion.     Left foot: Decreased range of motion. Swelling (4th and 5th toes of left foot) and tenderness present. Normal pulse.     Comments: Bruising and swelling noted at the base of the 4th and 5th metatarsals.  Decreased range of motion noted to the 4th and 5th toes of the left foot.  Cap  refill > 2 seconds.  Neurovascularly intact.  Skin:    General: Skin is warm and dry.  Neurological:     General: No focal deficit present.     Mental Status: She is alert and oriented to person, place, and time.  Psychiatric:        Mood and Affect: Mood normal.        Behavior: Behavior normal.      UC Treatments / Results  Labs (all labs ordered are listed, but only abnormal results are displayed) Labs Reviewed - No data to display  EKG   Radiology DG Foot Complete Left Result Date: 08/30/2023 CLINICAL DATA:  Pain post trauma hearing coffee table EXAM: LEFT FOOT - COMPLETE 3+ VIEW COMPARISON:  None Available. FINDINGS: Fracture oblique spiral fracture proximal phalanx fifth toe with near anatomic alignment. IMPRESSION: Fracture fifth toe proximal phalanx Electronically Signed   By: Fredrich Jefferson M.D.   On: 08/30/2023 09:09    Procedures Procedures (including critical care time)  Medications Ordered in UC Medications - No data to display  Initial Impression / Assessment and Plan / UC Course  I have reviewed the triage vital signs and the nursing notes.  Pertinent labs & imaging results that were available during my care of the patient were reviewed by me and considered in my medical decision making (see chart for details).  X-ray of the left foot shows a fracture at the fifth proximal phalanx.  4th and 5th toes of the left foot were buddy taped and postop shoe was provided.  Supportive care recommendations were provided and discussed with the patient to include over-the-counter analgesics and RICE therapy.  Patient was given information to follow-up with orthopedics within the next 24 to 48 hours for further evaluation.  Patient was in agreement with this plan of care and verbalizes understanding.  All questions were answered.  Patient stable for discharge.  Final Clinical Impressions(s) / UC Diagnoses   Final diagnoses:  Closed nondisplaced fracture of proximal phalanx of  lesser toe of left foot, initial encounter     Discharge Instructions      The x-ray of  your toe shows that your left small toe is fractured. Keep the toes buddy taped and wear the postop shoe until you have followed up with orthopedics. RICE therapy, rest, ice, compression, and elevation.  Apply ice for 20 minutes, remove for 1 hour, repeat as much as possible to help with pain and swelling. Weightbearing as tolerated. You may take over-the-counter Tylenol or ibuprofen as needed for pain or discomfort. Follow-up with orthopedics within the next 24 to 48 hours for reevaluation. Follow-up as needed.     ED Prescriptions   None    PDMP not reviewed this encounter.   Hardy Lia, NP 08/30/23 [phone]

## 2023-08-30 NOTE — ED Triage Notes (Signed)
 Hit coffee table last night.  Pain and bruising to left 4th and 5th toe.

## 2023-08-30 NOTE — Discharge Instructions (Signed)
 The x-ray of your toe shows that your left small toe is fractured. Keep the toes buddy taped and wear the postop shoe until you have followed up with orthopedics. RICE therapy, rest, ice, compression, and elevation.  Apply ice for 20 minutes, remove for 1 hour, repeat as much as possible to help with pain and swelling. Weightbearing as tolerated. You may take over-the-counter Tylenol or ibuprofen as needed for pain or discomfort. Follow-up with orthopedics within the next 24 to 48 hours for reevaluation. Follow-up as needed.

## 2023-09-07 ENCOUNTER — Ambulatory Visit: Admitting: Orthopedic Surgery

## 2023-09-07 ENCOUNTER — Encounter: Payer: Self-pay | Admitting: Orthopedic Surgery

## 2023-09-07 VITALS — BP 117/73 | HR 92

## 2023-09-07 DIAGNOSIS — S92515A Nondisplaced fracture of proximal phalanx of left lesser toe(s), initial encounter for closed fracture: Secondary | ICD-10-CM | POA: Diagnosis not present

## 2023-09-07 NOTE — Progress Notes (Signed)
 New Patient Visit  Assessment: Ashlee Grant is a 33 y.o. female with the following: 1. Closed nondisplaced fracture of proximal phalanx of lesser toe of left foot, initial encounter  Plan: Craige Dixon stubbed her small toe on the left foot, and sustained a proximal phalanx fracture.  She has a lot of bruising, minimal swelling.  She has been increasing her activity as tolerated.  Injury was discussed.  Postop shoe as needed.  Elevate the foot to help with swelling.  Medicines as needed.  Can consider buddy taping, or toe spacer to help with alignment and her pain.  She states understanding.  If she has any further issues, she will contact the clinic.  Follow-up: Return if symptoms worsen or fail to improve.  Subjective:  Chief Complaint  Patient presents with   Fracture    L pinky toe DOI 08/29/23    History of Present Illness: Ashlee Grant is a 33 y.o. female who presents for evaluation of left foot pain.  About a week ago, she stubbed her small toe.  She had immediate pain.  She was seen in the emergency department.  She was noted to have a fracture of the proximal phalanx of the small toe.  She was given a postop shoe.  She works from home, and states she has not been wearing the shoe.  She has been slowly increasing her activities.   Review of Systems: No fevers or chills No numbness or tingling No chest pain No shortness of breath No bowel or bladder dysfunction No GI distress No headaches   Medical History:  Past Medical History:  Diagnosis Date   Migraine     Past Surgical History:  Procedure Laterality Date   TONSILLECTOMY      Family History  Problem Relation Age of Onset   Seizures Mother    Stroke Paternal Aunt    Stroke Paternal Uncle    Stroke Maternal Grandmother    Stroke Maternal Grandfather    Migraines Neg Hx    Social History   Tobacco Use   Smoking status: Never   Smokeless tobacco: Never  Vaping Use    Vaping status: Never Used  Substance Use Topics   Alcohol use: Not Currently   Drug use: Never    Allergies  Allergen Reactions   Effexor [Venlafaxine]     Current Meds  Medication Sig   Magnesium Oxide 250 MG TABS Take by mouth.   Multiple Vitamin (MULTIVITAMIN) capsule Take 1 capsule by mouth daily.   spironolactone (ALDACTONE) 25 MG tablet Take 25 mg by mouth daily.   Ubrogepant  (UBRELVY ) 50 MG TABS Take 1 tablet (50 mg total) by mouth every 2 (two) hours as needed. Max twice per day.   verapamil (CALAN) 40 MG tablet Take 40 mg by mouth 3 (three) times daily.    Objective: BP 117/73   Pulse 92   LMP 08/29/2023 (Exact Date)   Physical Exam:  General: Alert and oriented. and No acute distress. Gait: Left sided antalgic gait.  Left foot with mild swelling.  Ecchymosis over the lateral forefoot area.  Tenderness to palpation of the lesser toe.  Toes are warm and well-perfused.  Sensation intact to all toes.  IMAGING: I personally reviewed images previously obtained from the ED  X-rays from the ED were available.  Oblique fracture through the proximal phalanx of the small toe.  Minimal to no displacement.  No additional injuries.   New Medications:  No  orders of the defined types were placed in this encounter.     Tonita Frater, MD  09/07/2023 4:15 PM

## 2023-09-28 NOTE — Progress Notes (Unsigned)
 09/29/23 [id number] returns for Botox . First procedure with Dr Tresia Fruit 06/2023. She reports some improvement in migraines but noted worsening over the past 2 weeks. She continues verapamil 40mg  (reduced recently from 80 per PCP), magnesium supplements and Ubrelvy  as needed. She is planning for pregnancy in the next year and has stopped Ajovy.     Consent Form Botulism Toxin Injection For Chronic Migraine    Reviewed orally with patient, additionally signature is on file:  Botulism toxin has been approved by the Federal drug administration for treatment of chronic migraine. Botulism toxin does not cure chronic migraine and it may not be effective in some patients.  The administration of botulism toxin is accomplished by injecting a small amount of toxin into the muscles of the neck and head. Dosage must be titrated for each individual. Any benefits resulting from botulism toxin tend to wear off after 3 months with a repeat injection required if benefit is to be maintained. Injections are usually done every 3-4 months with maximum effect peak achieved by about 2 or 3 weeks. Botulism toxin is expensive and you should be sure of what costs you will incur resulting from the injection.  The side effects of botulism toxin use for chronic migraine may include:   -Transient, and usually mild, facial weakness with facial injections  -Transient, and usually mild, head or neck weakness with head/neck injections  -Reduction or loss of forehead facial animation due to forehead muscle weakness  -Eyelid drooping  -Dry eye  -Pain at the site of injection or bruising at the site of injection  -Double vision  -Potential unknown long term risks   Contraindications: You should not have Botox  if you are pregnant, nursing, allergic to albumin, have an infection, skin condition, or muscle weakness at the site of the injection, or have myasthenia gravis, Lambert-Eaton syndrome, or ALS.  It is also possible  that as with any injection, there may be an allergic reaction or no effect from the medication. Reduced effectiveness after repeated injections is sometimes seen and rarely infection at the injection site may occur. All care will be taken to prevent these side effects. If therapy is given over a long time, atrophy and wasting in the muscle injected may occur. Occasionally the patient's become refractory to treatment because they develop antibodies to the toxin. In this event, therapy needs to be modified.  I have read the above information and consent to the administration of botulism toxin.    BOTOX  PROCEDURE NOTE FOR MIGRAINE HEADACHE  Contraindications and precautions discussed with patient(above). Aseptic procedure was observed and patient tolerated procedure. Procedure performed by Terrilyn Fick, FNP-C.   The condition has existed for more than 6 months, and pt does not have a diagnosis of ALS, Myasthenia Gravis or Lambert-Eaton Syndrome.  Risks and benefits of injections discussed and pt agrees to proceed with the procedure.  Written consent obtained  These injections are medically necessary. Pt  receives good benefits from these injections. These injections do not cause sedations or hallucinations which the oral therapies may cause.   Description of procedure:  The patient was placed in a sitting position. The standard protocol was used for Botox  as follows, with 5 units of Botox  injected at each site:  -Procerus muscle, midline injection  -Corrugator muscle, bilateral injection  -Frontalis muscle, bilateral injection, with 2 sites each side, medial injection was performed in the upper one third of the frontalis muscle, in the region vertical from the medial inferior edge of  the superior orbital rim. The lateral injection was again in the upper one third of the forehead vertically above the lateral limbus of the cornea, 1.5 cm lateral to the medial injection site.  -Temporalis muscle  injection, 4 sites, bilaterally. The first injection was 3 cm above the tragus of the ear, second injection site was 1.5 cm to 3 cm up from the first injection site in line with the tragus of the ear. The third injection site was 1.5-3 cm forward between the first 2 injection sites. The fourth injection site was 1.5 cm posterior to the second injection site. 5th site laterally in the temporalis  muscleat the level of the outer canthus.  -Occipitalis muscle injection, 3 sites, bilaterally. The first injection was done one half way between the occipital protuberance and the tip of the mastoid process behind the ear. The second injection site was done lateral and superior to the first, 1 fingerbreadth from the first injection. The third injection site was 1 fingerbreadth superiorly and medially from the first injection site.  -Cervical paraspinal muscle injection, 2 sites, bilaterally. The first injection site was 1 cm from the midline of the cervical spine, 3 cm inferior to the lower border of the occipital protuberance. The second injection site was 1.5 cm superiorly and laterally to the first injection site.  -Trapezius muscle injection was performed at 3 sites, bilaterally. The first injection site was in the upper trapezius muscle halfway between the inflection point of the neck, and the acromion. The second injection site was one half way between the acromion and the first injection site. The third injection was done between the first injection site and the inflection point of the neck.   Will return for repeat injection in 3 months.   A total of 200 units of Botox  was prepared, 155 units of Botox  was injected as documented above, any Botox  not injected was wasted. The patient tolerated the procedure well, there were no complications of the above procedure.

## 2023-09-29 ENCOUNTER — Ambulatory Visit (INDEPENDENT_AMBULATORY_CARE_PROVIDER_SITE_OTHER): Admitting: Family Medicine

## 2023-09-29 ENCOUNTER — Encounter: Payer: Self-pay | Admitting: Family Medicine

## 2023-09-29 VITALS — HR 96

## 2023-09-29 DIAGNOSIS — G43709 Chronic migraine without aura, not intractable, without status migrainosus: Secondary | ICD-10-CM

## 2023-09-29 MED ORDER — ONABOTULINUMTOXINA 200 UNITS IJ SOLR
155.0000 [IU] | Freq: Once | INTRAMUSCULAR | Status: AC
  Administered 2023-09-29: 155 [IU] via INTRAMUSCULAR

## 2023-09-29 NOTE — Progress Notes (Signed)
 Botox - 200 units x 1 vial Lot: [id number] Expiration: 02/2026 NDC: 1610-9604-54  Bacteriostatic 0.9% Sodium Chloride- 4 mL  Lot: [id number] Expiration: 10/13/2024 NDC: 1478-2956-21  Dx: H08.657 B/B Witnessed by Sherlie Distance

## 2023-10-06 ENCOUNTER — Encounter (INDEPENDENT_AMBULATORY_CARE_PROVIDER_SITE_OTHER): Payer: Self-pay | Admitting: Neurology

## 2023-10-06 DIAGNOSIS — Z0289 Encounter for other administrative examinations: Secondary | ICD-10-CM | POA: Diagnosis not present

## 2023-10-11 ENCOUNTER — Other Ambulatory Visit: Payer: Self-pay | Admitting: Neurology

## 2023-10-11 DIAGNOSIS — G43009 Migraine without aura, not intractable, without status migrainosus: Secondary | ICD-10-CM

## 2023-10-11 MED ORDER — UBRELVY 100 MG PO TABS: 100.0000 mg | ORAL_TABLET | ORAL | 11 refills | Status: AC | PRN

## 2023-10-11 NOTE — Telephone Encounter (Signed)
 Please see the MyChart message reply(ies) for my assessment and plan.    This patient gave consent for this Medical Advice Message and is aware that it may result in a bill to [employer], as well as the possibility of receiving a bill for a co-payment or deductible. They are an established patient, but are not seeking medical advice exclusively about a problem treated during an in person or video visit in the last seven days. I did not recommend an in person or video visit within seven days of my reply.    I spent a total of 5 minutes cumulative time within 7 days through [employer].  Anson Fret, MD

## 2023-12-22 ENCOUNTER — Telehealth: Payer: Self-pay | Admitting: Neurology

## 2023-12-22 NOTE — Telephone Encounter (Signed)
 Noted.  See for migraines.

## 2023-12-22 NOTE — Telephone Encounter (Signed)
 Pt called and stated that she received a message asking if she would be ok doing her appt as a VV. Pt called to accept and have her appt changed.

## 2023-12-28 ENCOUNTER — Telehealth (INDEPENDENT_AMBULATORY_CARE_PROVIDER_SITE_OTHER): Admitting: Neurology

## 2023-12-28 ENCOUNTER — Telehealth: Payer: Self-pay | Admitting: Neurology

## 2023-12-28 DIAGNOSIS — G43109 Migraine with aura, not intractable, without status migrainosus: Secondary | ICD-10-CM | POA: Diagnosis not present

## 2023-12-28 DIAGNOSIS — G43709 Chronic migraine without aura, not intractable, without status migrainosus: Secondary | ICD-10-CM | POA: Diagnosis not present

## 2023-12-28 DIAGNOSIS — L709 Acne, unspecified: Secondary | ICD-10-CM

## 2023-12-28 MED ORDER — VERAPAMIL HCL 40 MG PO TABS: 40.0000 mg | ORAL_TABLET | Freq: Every day | ORAL | 3 refills | Status: AC

## 2023-12-28 MED ORDER — ONDANSETRON 8 MG PO TBDP: 8.0000 mg | ORAL_TABLET | Freq: Three times a day (TID) | ORAL | 11 refills | Status: AC | PRN

## 2023-12-28 MED ORDER — QULIPTA 60 MG PO TABS: 60.0000 mg | ORAL_TABLET | Freq: Every day | ORAL | 11 refills | Status: AC

## 2023-12-28 MED ORDER — HYDROXYZINE HCL 10 MG PO TABS: 10.0000 mg | ORAL_TABLET | Freq: Three times a day (TID) | ORAL | 6 refills | Status: AC | PRN

## 2023-12-28 MED ORDER — SPIRONOLACTONE 25 MG PO TABS: 25.0000 mg | ORAL_TABLET | Freq: Every day | ORAL | 1 refills | Status: AC

## 2023-12-28 NOTE — Telephone Encounter (Signed)
 Pt scheduled for OV with Dr Ines for 07/02/24 at 11am

## 2023-12-28 NOTE — Telephone Encounter (Signed)
 6 month follow up with dr ines in office to discuss migraine management in pregnancy

## 2023-12-28 NOTE — Progress Notes (Signed)
 [facility]    Provider:  Dr Grant Requesting Provider: Vaughn Ashlee NOVAK, PA-C Primary Care Provider:  Vaughn Ashlee Grant Ashlee Grant  CC:  Migraines  Virtual Visit via Video Note  I connected with Ashlee Grant on 12/28/23 at  9:00 AM EDT by a video enabled telemedicine application and verified that I am speaking with the correct person using two identifiers.  Location: Patient: Home Provider: office   I discussed the limitations of evaluation and management by telemedicine and the availability of in person appointments. The patient expressed understanding and agreed to proceed.   Follow Up Instructions:    I discussed the assessment and treatment plan with the patient. The patient was provided an opportunity to ask questions and all were answered. The patient agreed with the plan and demonstrated an understanding of the instructions.   The patient was advised to call back or seek an in-person evaluation if the symptoms worsen or if the condition fails to improve as anticipated.  I provided 30 minutes of non-face-to-face time during this encounter.   Ashlee Grant Ines, MD;  12/28/2023: Follow up, she is due next Wednesday for botox . She would like to position the botox  a little lower on the forehead. She is doing well. The weather has really affected her. She feels like the ubrelvy  helps acutely. At baseline she had at least 15 migraine days a month that are moderate to severe without aura and daily headaches. She feels the botox  improved severity and freq by > 50% and now with 5 moderate migraine days and 1 severe migraine day a month and < 12 total headache days a month. She functions much better. Still on verapamil  40mg  at bedtime. She is trying to have children in December. Will layer qulipta  on top of the botox  and stop when trying to get pregnant. All remaining nerves and muscles (as indicated in the following tables) were within normal limits.  Patient  complains of symptoms per HPI as well as the following symptoms: none . Pertinent negatives and positives per HPI. All others negative   09/29/23 Ashlee Grant returns for Botox . First procedure with Dr Grant 06/2023. She reports some improvement in migraines but noted worsening over the past 2 weeks. She continues verapamil  40mg  (reduced recently from 80 per PCP), magnesium supplements and Ubrelvy  as needed. She is planning for pregnancy in the next year and has stopped Ajovy.   HPI 04/07/2023:  Ashlee Grant is a 33 y.o. female here as requested by Ashlee Ashlee NOVAK, PA-C for headaches.  I reviewed charts from [facility].  She was diagnosed with vestibular migraine, class I obesity 31-31.9 in adult, past medical history/problem list includes eustachian tube dysfunction, benign paroxysmal positional vertigo, obesity.,  Migraine, headache, palpitations.  Notes are from Ashlee Grant, [place].  Ashlee reported that in October 2024 patient was currently trying to wean off migraine preventative therapy due to trying to become pregnant in the near future.  They prescribed a prednisone taper due to rebound headache.  They started weaning her off verapamil  slowly, prescribe Zofran  as needed for nausea, they also ordered blood work to further evaluate patient's symptoms.  And they referred her to headache specialist.  By report she was seen for daily migraines since stopping her preventative migraine medications currently wanting to become pregnant in the future.  She also reported generalized fatigue.  Physical neurologic exam was normal. She has an aura bright lights and squiggles according to Dr. Marikay notes from 07/08/2021.  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 Vivian stated her symptoms not consistent with that diagnosis. GYN stopped her OCP as she has auras.  She has been on multiple medications. She has daily headaches and 12 of those are migraines;  migraines are at least 12 a  month that are moderate to severe without aura that can last 8-24 hours, she has pulsating/pounding/throbbing, no medication overuse, nausea, photophobia/phonophobia/osmophobia, nausea. No vomiting and it affects the quality of life.  Discussed options in depth. Reviewed cgrps, older tradiional medications an dbotox pros and cons   From a thorough review of records, medications tried for migraine and headache management greater than 3 months includes: Amitriptyline, Voltaren, hydroxyzine , lasmiditan hand, Zofran , Ubrelvy , verapamil , diclofenac, Zofran , prednisone, ajovy, triptans contraindicated due to brainstem aura in some of her migraines, ajovy, emgality, aimovig(had constipation) and the following  Dr. Vivian: ANALGESICS: Tylenol, Excedrin ANTI-MIGRAINE: no triptans for now- 1 possible hemiplegic migraine, Ubrelvy - effective, HEART/BP: Verapamil  DECONGESTANT/ANTIHISTAMINE: Meclizine, Flonase , hydroxyzine  ANTI-NAUSEANT: NSAIDS: Toradol, Advil, diclofenac MUSCLE RELAXANTS: Baclofen-nausea, chlorzoxazone ANTI-CONVULSANTS: Topamax (severe dry eyes) STEROIDS: Prednisone  SLEEPING PILLS/TRANQUILIZERS: ANTI-DEPRESSANTS: Effexor tongue numb/ panic attack, Amitriptyline effective, imipramine- ineffective HERBAL:  FIBROMYALGIA:  HORMONAL: OTHER: Emgality, Ajovy- effective, Aimovig- constipation PROCEDURES FOR HEADACHES: TPI- vasovagal rx, SPG   Reviewed notes, labs and imaging from outside physicians, which showed: see above  01/25/2023: THYROID  [facility]10/04/2022 Component 01/25/2023 03/26/2022 09/05/2020         TSH 0.640 0.873 1.280 Load older lab results   03/26/2022: CHEM PROFILE  [facility]12/04/2021 Component 03/26/2022 03/18/2021 01/13/2021 09/05/2020          Sodium 140 140 -- 139 Load older lab results  Potassium 4.4 4.7 4.4 4.5 Load older lab results  Chloride 103 101 -- 101 Load older lab results  Carbon Dioxide (CO2) -- -- -- -- Load older lab results  Anion Gap  -- -- -- -- Load older lab results  Glucose 86 76 -- 84 Load older lab results  BUN 11 11 -- 12 Load older lab results  Creatinine 0.72 0.75 -- 0.78 Load older lab results  Calcium -- -- -- -- Load older lab results  Alkaline Phosphatase 58 72 -- 64 Load older lab results  Total Bilirubin <0.2 0.3 -- 0.2 Load older lab results  Total Protein 7.0 6.9 -- 7.2 Load older lab results  Albumin, Serum 4.8 4.7 -- 4.6 Load older lab results  ALT (SGPT) 33 High    30 -- 24 Load older lab results  SGOT (AST) -- -- -- -- Load older lab results  eGFR If NonAfrican American -- -- -- -- Load older lab results  eGFR If African American -- -- -- -- Load older lab results  BUN/Creatinine Ratio 15 15 -- 15 Load older lab results  CO2 24 22 -- 22 Load older lab results  Albumin -- -- -- -- Load older lab results  Globulin, Total 2.2 2.2 -- 2.6 Load older lab results  Albumin/Globulin Ratio 2.2 2.1 -- 1.8 Load older lab results  AST 24 23 -- 22 Load older lab results  CALCIUM 9.8 9.9 -- 9.5 Load older lab results  Na -- -- -- -- Load older lab results  Cl -- -- -- -- Load older lab results  Ca -- -- -- -- Load older lab results  ALK PHOS -- -- -- -- Load older lab results  T Bili -- -- -- -- Load older lab results  Alb -- -- -- -- Load  older lab results  GLOBULIN -- -- -- -- Load older lab results  ALBUMIN/GLOBULIN RATIO -- -- -- -- Load older lab results  BUN/CREAT RATIO -- -- -- -- Load older lab results  ALT -- -- -- -- Load older lab results  GFR AFRICAN AMERICAN -- -- -- -- Load older lab results  GFR Non African American -- -- -- -- Load older lab results  AGAP -- -- -- -- Load older lab results  Mg -- -- -- -- Load older lab results  eGFR 116 111 -- 107    06/03/2018: Impression  IMPRESSION: 1.  No acute intracranial abnormality.  Electronically Signed by: Elsie Dayhoff Narrative  INDICATION: new continuous vertigo, migraines.  COMPARISON: None  TECHNIQUE: Multiplanar, multisequence  MR imaging of the brain was obtained without IV contrast.  FINDINGS: #  Skull/marrow/soft tissues: Unremarkable. #  Orbits: Unremarkable. #  Sinuses: Unremarkable. #  Brain: No evidence of acute abnormality.  No significant white matter disease or acute ischemia.  No mass effect, hemorrhage, or hydrocephalus.  The CP angle cisterns, internal auditory canals and inner ears structures appear grossly normal on this unenhanced study. No brainstem or cerebellar lesion. Grossly normal flow-related signal in the major intracranial arteries and dural sinuses..  #  Additional comments: None. Other Result Text  Acute Interface, Incoming Rad Results - 06/13/2018 10:07 AM EST INDICATION: new continuous vertigo, migraines.  COMPARISON: None  TECHNIQUE: Multiplanar, multisequence MR imaging of the brain was obtained without IV contrast.  FINDINGS: #  Skull/marrow/soft tissues: Unremarkable. #  Orbits: Unremarkable. #  Sinuses: Unremarkable. #  Brain: No evidence of acute abnormality.  No significant white matter disease or acute ischemia.  No mass effect, hemorrhage, or hydrocephalus.  The CP angle cisterns, internal auditory canals and inner ears structures appear grossly normal on this unenhanced study. No brainstem or cerebellar lesion. Grossly normal flow-related signal in the major intracranial arteries and dural sinuses..  #  Additional comments: None.   IMPRESSION: 1.  No acute intracranial abnormality.  Review of Systems: Patient complains of symptoms per HPI as well as the following symptoms none. Pertinent negatives and positives per HPI. All others negative.   Social History   Socioeconomic History   Marital status: Married    Spouse name: Not on file   Number of children: Not on file   Years of education: Not on file   Highest education level: Not on file  Occupational History   Not on file  Tobacco Use   Smoking status: Never   Smokeless tobacco: Never  Vaping Use    Vaping status: Never Used  Substance and Sexual Activity   Alcohol use: Not Currently   Drug use: Never   Sexual activity: Yes  Other Topics Concern   Not on file  Social History Narrative   Not on file   Social Drivers of Health   Financial Resource Strain: Low Risk  (12/01/2022)   Received from [facility]   Overall Financial Resource Strain (CARDIA)    Difficulty of Paying Living Expenses: Not hard at all  Food Insecurity: No Food Insecurity (12/01/2022)   Received from [facility]   Hunger Vital Sign    Within the past 12 months, you worried that your food would run out before you got the money to buy more.: Never true    Within the past 12 months, the food you bought just didn't last and you didn't have money to get more.: Never true  Transportation Needs:  No Transportation Needs (12/01/2022)   Received from [facility]   PRAPARE - Transportation    Lack of Transportation (Medical): No    Lack of Transportation (Non-Medical): No  Physical Activity: Insufficiently Active (12/01/2022)   Received from [facility]   Exercise Vital Sign    On average, how many days per week do you engage in moderate to strenuous exercise (like a brisk walk)?: 2 days    On average, how many minutes do you engage in exercise at this level?: 20 min  Stress: No Stress Concern Present (12/01/2022)   Received from [facility] - Occupational Stress Questionnaire    Feeling of Stress : Only a little  Social Connections: Moderately Integrated (12/01/2022)   Received from [facility]   Social Network    How would you rate your social network (family, work, friends)?: Adequate participation with social networks  Intimate Partner Violence: Not At Risk (12/01/2022)   Received from [facility]   HITS    Over the last 12 months how often did your partner physically hurt you?: Never    Over the last 12 months how often did your partner insult you or talk down to  you?: Never    Over the last 12 months how often did your partner threaten you with physical harm?: Never    Over the last 12 months how often did your partner scream or curse at you?: Never    Family History  Problem Relation Age of Onset   Seizures Mother    Stroke Paternal Aunt    Stroke Paternal Uncle    Stroke Maternal Grandmother    Stroke Maternal Grandfather    Migraines Neg Hx     Past Medical History:  Diagnosis Date   Migraine     Patient Active Problem List   Diagnosis Date Noted   Migraine with aura and without status migrainosus, not intractable 12/28/2023   Chronic migraine without aura without status migrainosus, not intractable 04/10/2023   Acid reflux 09/24/2020   BPPV (benign paroxysmal positional vertigo), bilateral 12/08/2018   Class 1 obesity due to excess calories without serious comorbidity with body mass index (BMI) of 31.0 to 31.9 in adult 12/08/2018   ETD (Eustachian tube dysfunction), bilateral 07/03/2018   Peripheral vertigo of both ears 07/03/2018   Vestibular migraine 11/06/2013   Hemorrhoids 04/08/2011   Abdominal pain 03/11/2011    Past Surgical History:  Procedure Laterality Date   TONSILLECTOMY      Current Outpatient Medications  Medication Sig Dispense Refill   Atogepant  (QULIPTA ) 60 MG TABS Take 1 tablet (60 mg total) by mouth daily. Please run copay card: BIN [id number] PCN [id number] GRP [id number] ID [id number] EXP 04/25/2024 30 tablet 11   hydrOXYzine  (ATARAX ) 10 MG tablet Take 1 tablet (10 mg total) by mouth 3 (three) times daily as needed. 30 tablet 6   ondansetron  (ZOFRAN -ODT) 8 MG disintegrating tablet Take 1 tablet (8 mg total) by mouth every 8 (eight) hours as needed. 20 tablet 11   Magnesium Oxide 250 MG TABS Take by mouth.     Multiple Vitamin (MULTIVITAMIN) capsule Take 1 capsule by mouth daily.     spironolactone  (ALDACTONE ) 25 MG tablet Take 1 tablet (25 mg total) by mouth daily. 90 tablet 1   Ubrogepant  (UBRELVY ) 100 MG TABS  Take 1 tablet (100 mg total) by mouth every 2 (two) hours as needed. Maximum 200mg  a day. 16 tablet 11   verapamil  (  CALAN ) 40 MG tablet Take 1 tablet (40 mg total) by mouth daily. 90 tablet 3   No current facility-administered medications for this visit.    Allergies as of 12/28/2023 - Review Complete 09/29/2023  Allergen Reaction Noted   Effexor [venlafaxine]  06/18/2021    Vitals: There were no vitals taken for this visit. Last Weight:  Wt Readings from Last 1 Encounters:  04/07/23 173 lb (78.5 kg)   Last Height:   Ht Readings from Last 1 Encounters:  04/07/23 5' 3 (1.6 m)    Physical exam: Exam: Gen: NAD, conversant      CV: No palpitations or chest pain or SOB. VS: Breathing at a normal rate. Weight normal. Not febrile. Eyes: Conjunctivae clear without exudates or hemorrhage  Neuro: Detailed Neurologic Exam  Speech:    Speech is normal; fluent and spontaneous with normal comprehension.  Cognition:    The patient is oriented to person, place, and time;     recent and remote memory intact;     language fluent;     normal attention, concentration, fund of knowledge Cranial Nerves:    The pupils are equal, round, and reactive to light. Visual fields are full Extraocular movements are intact.  The face is symmetric with normal sensation. The palate elevates in the midline. Hearing intact. Voice is normal. Shoulder shrug is normal. The tongue has normal motion without fasciculations.   Coordination: normal  Gait:    No abnormalities noted or reported  Motor Observation:   no involuntary movements noted. Tone:    Appears normal  Posture:    Posture is normal. normal erect    Strength:    Strength is anti-gravity and symmetric in the upper and lower limbs.      Sensation: intact to LT, no reports of numbness or tingling or paresthesias         Assessment/Plan:  33 year old with migraines. She has been on multiple medications. She has daily headaches and 12  of those are migraines;  migraines are at least 12 a month that are moderate to severe without aura that can last 8-24 hours, she has pulsating/pounding/throbbing, no medication overuse, nausea, photophobia/phonophobia/osmophobia, nausea. No vomiting and it affects the quality of life.  Discussed options in depth. Reviewed cgrps, older tradiional medications an dbotox pros and cons  Start Qulipta . Stop 1-2 weeks prior to trying to get pregnant She is going off of verapamil , she likes it and has to go off for pregnany, start Qulipta  and when feeling better can stop once daily verapamil  but could start propranolol very low dose instead as an option to consider, watch blood pressure Continue botox  for migraines Continue ubrelvy  prn We can refill spironolactone  until she sees her pcp in December Can continue hydroxyzine  for migraines 6 months for appointment in office for management of migraines in pregnancy, we discussed today, nothing is entirely safe, she will have to go off of the majority of her meds, however most migraines do very well in their 2nd and third trimesters, in the meantime will try and get her migraine baseline reducedas much as we can   Meds ordered this encounter  Medications   Atogepant  (QULIPTA ) 60 MG TABS    Sig: Take 1 tablet (60 mg total) by mouth daily. Please run copay card: BIN [id number] PCN [id number] GRP [id number] ID [id number] EXP 04/25/2024    Dispense:  30 tablet    Refill:  11    Please run copay card: BIN [id number] PCN  CNRX GRP [id number] ID [id number] EXP 04/25/2024   spironolactone  (ALDACTONE ) 25 MG tablet    Sig: Take 1 tablet (25 mg total) by mouth daily.    Dispense:  90 tablet    Refill:  1   verapamil  (CALAN ) 40 MG tablet    Sig: Take 1 tablet (40 mg total) by mouth daily.    Dispense:  90 tablet    Refill:  3   hydrOXYzine  (ATARAX ) 10 MG tablet    Sig: Take 1 tablet (10 mg total) by mouth 3 (three) times daily as needed.    Dispense:  30 tablet    Refill:  6    ondansetron  (ZOFRAN -ODT) 8 MG disintegrating tablet    Sig: Take 1 tablet (8 mg total) by mouth every 8 (eight) hours as needed.    Dispense:  20 tablet    Refill:  11     To prevent or relieve headaches, try the following: Cool Compress. Lie down and place a cool compress on your head.  Avoid headache triggers. If certain foods or odors seem to have triggered your migraines in the past, avoid them. A headache diary might help you identify triggers.  Include physical activity in your daily routine. Try a daily walk or other moderate aerobic exercise.  Manage stress. Find healthy ways to cope with the stressors, such as delegating tasks on your to-do list.  Practice relaxation techniques. Try deep breathing, yoga, massage and visualization.  Eat regularly. Eating regularly scheduled meals and maintaining a healthy diet might help prevent headaches. Also, drink plenty of fluids.  Follow a regular sleep schedule. Sleep deprivation might contribute to headaches Consider biofeedback. With this mind-body technique, you learn to control certain bodily functions -- such as muscle tension, heart rate and blood pressure -- to prevent headaches or reduce headache pain.    Proceed to emergency room if you experience new or worsening symptoms or symptoms do not resolve, if you have new neurologic symptoms or if headache is severe, or for any concerning symptom.   Provided education and documentation from American headache Society toolbox including articles on: chronic migraine medication overuse headache, chronic migraines, prevention of migraines, behavioral and other nonpharmacologic treatments for headache.  Cc: Ashlee Ashlee KATHEE Ashlee Grant Ashlee Ashlee KATHEE, PA-C  Ashlee Epp, MD  [facility] [address] [place], [place] [address]  Phone [phone] Fax [phone]

## 2023-12-28 NOTE — Patient Instructions (Addendum)
 Start Qulipta . Stop 1-2 weeks prior to trying to get pregnant She is going off of verapamil , she likes it and has to go off for pregnany, start Qulipta  and when feeling better can stop once daily verapamil  but could start propranolol very low dose instead as an option to consider, watch blood pressure Continue botox  for migraines Continue ubrelvy  prn when trying to get pregnant can stop and see me for discussion on migraine management in pregnacy We can refill spironolactone  until she sees her pcp in December - stop for pregnancy Can continue hydroxyzine  for migraines  Propranolol Tablets What is this medication? PROPRANOLOL (proe PRAN oh lole) treats many conditions such as high blood pressure, tremors, and a type of arrhythmia known as AFib (atrial fibrillation). It works by lowering your blood pressure and heart rate, making it easier for your heart to pump blood to the rest of your body. It may be used to prevent migraine headaches. It works by relaxing the blood vessels in the brain that cause migraines. It belongs to a group of medications called beta blockers. This medicine may be used for other purposes; ask your health care provider or pharmacist if you have questions. COMMON BRAND NAME(S): Inderal What should I tell my care team before I take this medication? They need to know if you have any of these conditions: Diabetes Having surgery Heart or blood vessel conditions, such as slow heartbeat, heart failure, heart block Kidney disease Liver disease Lung or breathing disease, such as asthma or COPD Myasthenia gravis Pheochromocytoma Thyroid disease An unusual or allergic reaction to propranolol, other medications, foods, dyes, or preservatives Pregnant or trying to get pregnant Breastfeeding How should I use this medication? Take this medication by mouth. Take it as directed on the prescription label at the same time every day. Keep taking it unless your care team tells you to  stop. Talk to your care team about the use of this medication in children. Special care may be needed. Overdosage: If you think you have taken too much of this medicine contact a poison control center or emergency room at once. NOTE: This medicine is only for you. Do not share this medicine with others. What if I miss a dose? If you miss a dose, take it as soon as you can. If it is almost time for your next dose, take only that dose. Do not take double or extra doses. What may interact with this medication? Do not take this medication with any of the following: Thioridazine This medication may also interact with the following: Certain medications for blood pressure, heart disease, irregular heartbeat Epinephrine NSAIDs, medications for pain and inflammation, such as ibuprofen or naproxen Warfarin Other medications may affect the way this medication works. Talk with your care team about all of the medications you take. They may suggest changes to your treatment plan to lower the risk of side effects and to make sure your medications work as intended. This list may not describe all possible interactions. Give your health care provider a list of all the medicines, herbs, non-prescription drugs, or dietary supplements you use. Also tell them if you smoke, drink alcohol, or use illegal drugs. Some items may interact with your medicine. What should I watch for while using this medication? Visit your care team for regular checks on your progress. Check your blood pressure as directed. Know what your blood pressure should be and when to contact your care team. This medication may affect your coordination, reaction time, or  judgment. Do not drive or operate machinery until you know how this medication affects you. Sit up or stand slowly to reduce the risk of dizzy or fainting spells. Drinking alcohol with this medication can increase the risk of these side effects. Do not suddenly stop taking this  medication. This may increase your risk of side effects, such as chest pain and heart attack. If you no longer need to take this medication, your care team will lower the dose slowly over time to decrease the risk of side effects. If you are going to need surgery or a procedure, tell your care team that you are using this medication. This medication may affect blood glucose levels. It can also mask the symptoms of low blood sugar, such as a rapid heartbeat and tremors. If you have diabetes, it is important to check your blood sugar often while you are taking this medication. Do not treat yourself for coughs, colds, or pain while you are using this medication without asking your care team for advice. Some medications may increase your blood pressure. What side effects may I notice from receiving this medication? Side effects that you should report to your care team as soon as possible: Allergic reactions--skin rash, itching, hives, swelling of the face, lips, tongue, or throat Heart failure--shortness of breath, swelling of the ankles, feet, or hands, sudden weight gain, unusual weakness or fatigue Low blood pressure--dizziness, feeling faint or lightheaded, blurry vision Raynaud's--cool, numb, or painful fingers or toes that may change color from pale, to blue, to red Redness, blistering, peeling, or loosening of the skin, including inside the mouth Slow heartbeat--dizziness, feeling faint or lightheaded, confusion, trouble breathing, unusual weakness or fatigue Worsening mood, feelings of depression Side effects that usually do not require medical attention (report to your care team if they continue or are bothersome): Change in sex drive or performance Diarrhea Dizziness Fatigue Headache This list may not describe all possible side effects. Call your doctor for medical advice about side effects. You may report side effects to FDA at 1-800-FDA-1088. Where should I keep my medication? Keep out  of the reach of children and pets. Store at room temperature between 20 and 25 degrees C (68 and 77 degrees F). Protect from light. Throw away any unused medication after the expiration date. NOTE: This sheet is a summary. It may not cover all possible information. If you have questions about this medicine, talk to your doctor, pharmacist, or health care provider.  2024 Elsevier/Gold Standard (2022-04-12 00:00:00)

## 2024-01-03 ENCOUNTER — Other Ambulatory Visit (HOSPITAL_COMMUNITY): Payer: Self-pay

## 2024-01-03 ENCOUNTER — Telehealth: Payer: Self-pay | Admitting: Pharmacy Technician

## 2024-01-03 NOTE — Progress Notes (Unsigned)
 01/04/24 ALL: Ashlee Grant returns for Botox . She has had little improvement over the past 12 weeks. Qulipta  recently prescribed but has not been started. She requests frontalis injections to be administered lower.   09/29/2023 ALL: Ashlee Grant returns for Botox . First procedure with Dr Ines 06/2023. She reports some improvement in migraines but noted worsening over the past 2 weeks. She continues verapamil  40mg  (reduced recently from 80 per PCP), magnesium supplements and Ubrelvy  as needed. She is planning for pregnancy in the next year and has stopped Ajovy.     Consent Form Botulism Toxin Injection For Chronic Migraine    Reviewed orally with patient, additionally signature is on file:  Botulism toxin has been approved by the Federal drug administration for treatment of chronic migraine. Botulism toxin does not cure chronic migraine and it may not be effective in some patients.  The administration of botulism toxin is accomplished by injecting a small amount of toxin into the muscles of the neck and head. Dosage must be titrated for each individual. Any benefits resulting from botulism toxin tend to wear off after 3 months with a repeat injection required if benefit is to be maintained. Injections are usually done every 3-4 months with maximum effect peak achieved by about 2 or 3 weeks. Botulism toxin is expensive and you should be sure of what costs you will incur resulting from the injection.  The side effects of botulism toxin use for chronic migraine may include:   -Transient, and usually mild, facial weakness with facial injections  -Transient, and usually mild, head or neck weakness with head/neck injections  -Reduction or loss of forehead facial animation due to forehead muscle weakness  -Eyelid drooping  -Dry eye  -Pain at the site of injection or bruising at the site of injection  -Double vision  -Potential unknown long term risks   Contraindications: You should not have Botox   if you are pregnant, nursing, allergic to albumin, have an infection, skin condition, or muscle weakness at the site of the injection, or have myasthenia gravis, Lambert-Eaton syndrome, or ALS.  It is also possible that as with any injection, there may be an allergic reaction or no effect from the medication. Reduced effectiveness after repeated injections is sometimes seen and rarely infection at the injection site may occur. All care will be taken to prevent these side effects. If therapy is given over a long time, atrophy and wasting in the muscle injected may occur. Occasionally the patient's become refractory to treatment because they develop antibodies to the toxin. In this event, therapy needs to be modified.  I have read the above information and consent to the administration of botulism toxin.    BOTOX  PROCEDURE NOTE FOR MIGRAINE HEADACHE  Contraindications and precautions discussed with patient(above). Aseptic procedure was observed and patient tolerated procedure. Procedure performed by Greig Forbes, FNP-C.   The condition has existed for more than 6 months, and pt does not have a diagnosis of ALS, Myasthenia Gravis or Lambert-Eaton Syndrome.  Risks and benefits of injections discussed and pt agrees to proceed with the procedure.  Written consent obtained  These injections are medically necessary. Pt  receives good benefits from these injections. These injections do not cause sedations or hallucinations which the oral therapies may cause.   Description of procedure:  The patient was placed in a sitting position. The standard protocol was used for Botox  as follows, with 5 units of Botox  injected at each site:  -Procerus muscle, midline injection  -Corrugator muscle,  bilateral injection  -Frontalis muscle, bilateral injection, with 2 sites each side, medial injection was performed in the upper one third of the frontalis muscle, in the region vertical from the medial inferior edge of  the superior orbital rim. The lateral injection was again in the upper one third of the forehead vertically above the lateral limbus of the cornea, 1.5 cm lateral to the medial injection site.  -Temporalis muscle injection, 4 sites, bilaterally. The first injection was 3 cm above the tragus of the ear, second injection site was 1.5 cm to 3 cm up from the first injection site in line with the tragus of the ear. The third injection site was 1.5-3 cm forward between the first 2 injection sites. The fourth injection site was 1.5 cm posterior to the second injection site. 5th site laterally in the temporalis  muscleat the level of the outer canthus.  -Occipitalis muscle injection, 3 sites, bilaterally. The first injection was done one half way between the occipital protuberance and the tip of the mastoid process behind the ear. The second injection site was done lateral and superior to the first, 1 fingerbreadth from the first injection. The third injection site was 1 fingerbreadth superiorly and medially from the first injection site.  -Cervical paraspinal muscle injection, 2 sites, bilaterally. The first injection site was 1 cm from the midline of the cervical spine, 3 cm inferior to the lower border of the occipital protuberance. The second injection site was 1.5 cm superiorly and laterally to the first injection site.  -Trapezius muscle injection was performed at 3 sites, bilaterally. The first injection site was in the upper trapezius muscle halfway between the inflection point of the neck, and the acromion. The second injection site was one half way between the acromion and the first injection site. The third injection was done between the first injection site and the inflection point of the neck.   Will return for repeat injection in 3 months.   A total of 200 units of Botox  was prepared, 155 units of Botox  was injected as documented above, any Botox  not injected was wasted. The patient tolerated the  procedure well, there were no complications of the above procedure.

## 2024-01-03 NOTE — Telephone Encounter (Signed)
 Pharmacy Patient Advocate Encounter  Received notification from [facility] that Prior Authorization for Qulipta  60MG  tablets  has been APPROVED from 01/02/2025 to 07/01/2024. Ran test claim, Copay is $43.00. This test claim was processed through [facility]- copay amounts may vary at other pharmacies due to pharmacy/plan contracts, or as the patient moves through the different stages of their insurance plan.   PA #/Case ID/Reference #: [id number] KEY: [id number]

## 2024-01-04 ENCOUNTER — Encounter: Payer: Self-pay | Admitting: Family Medicine

## 2024-01-04 ENCOUNTER — Ambulatory Visit (INDEPENDENT_AMBULATORY_CARE_PROVIDER_SITE_OTHER): Admitting: Family Medicine

## 2024-01-04 VITALS — BP 118/78 | HR 73

## 2024-01-04 DIAGNOSIS — G43709 Chronic migraine without aura, not intractable, without status migrainosus: Secondary | ICD-10-CM | POA: Diagnosis not present

## 2024-01-04 MED ORDER — ONABOTULINUMTOXINA 200 UNITS IJ SOLR
155.0000 [IU] | Freq: Once | INTRAMUSCULAR | Status: AC
  Administered 2024-01-04: 155 [IU] via INTRAMUSCULAR

## 2024-01-04 NOTE — Progress Notes (Signed)
 Botox - 200 units x 1 vial Lot: [id number] Expiration: 02/2026 NDC: 9976-6078-97  Bacteriostatic 0.9% Sodium Chloride- 30 mL  Lot: [id number] Expiration: OCT-31-2026 NDC: 9590-8033-97  Dx: G43.709  B/B Witnessed ab:Zffj Joshua, RN

## 2024-01-16 ENCOUNTER — Other Ambulatory Visit (HOSPITAL_COMMUNITY): Payer: Self-pay

## 2024-03-14 ENCOUNTER — Telehealth: Payer: Self-pay | Admitting: Family Medicine

## 2024-03-14 DIAGNOSIS — G43709 Chronic migraine without aura, not intractable, without status migrainosus: Secondary | ICD-10-CM

## 2024-03-14 NOTE — Telephone Encounter (Signed)
 Submitted auth request via [facility] portal, status is pending with reference # [id number]. Requested them to add [facility] as servicing pharmacy.

## 2024-03-21 MED ORDER — ONABOTULINUMTOXINA 200 UNITS IJ SOLR
INTRAMUSCULAR | 3 refills | Status: AC
Start: 2024-03-21 — End: ?

## 2024-03-21 MED ORDER — ONABOTULINUMTOXINA 200 UNITS IJ SOLR
INTRAMUSCULAR | 3 refills | Status: DC
Start: 2024-03-21 — End: 2024-03-21

## 2024-03-21 NOTE — Telephone Encounter (Signed)
 Botox  200 unit Rx sent to [facility].   This was accidentally sent to [facility] first but I called [facility] and canceled it.

## 2024-03-21 NOTE — Telephone Encounter (Signed)
 Received approval, please send rx to [facility].  Auth#: [id number] (04/06/24-04/06/25)

## 2024-03-21 NOTE — Addendum Note (Signed)
 Addended by: HILLIARD HEATHER CROME on: 03/21/2024 10:43 AM   Modules accepted: Orders

## 2024-04-02 ENCOUNTER — Encounter: Payer: Self-pay | Admitting: Family Medicine

## 2024-04-09 ENCOUNTER — Ambulatory Visit (INDEPENDENT_AMBULATORY_CARE_PROVIDER_SITE_OTHER): Admitting: Family Medicine

## 2024-04-09 VITALS — BP 115/73

## 2024-04-09 DIAGNOSIS — G43709 Chronic migraine without aura, not intractable, without status migrainosus: Secondary | ICD-10-CM

## 2024-04-09 MED ORDER — ONABOTULINUMTOXINA 200 UNITS IJ SOLR
155.0000 [IU] | Freq: Once | INTRAMUSCULAR | Status: AC
  Administered 2024-04-09: 12:00:00 155 [IU] via INTRAMUSCULAR

## 2024-04-09 NOTE — Progress Notes (Signed)
 04/09/2024 ALL: Ashlee Grant returns for Botox . She reports doing well. She does have a migraine today. Ubrelvy  helps. She thinks greek yogurt may be a trigger. She did not start Qulipta  due to family planning. She continues verapamil  40mg  daily and Ubrelvy  as needed. She is planning to discontinue verapamil  04/2024.   09/29/2023 ALL: Ashlee Grant returns for Botox . First procedure with Dr Ines 06/2023. She reports some improvement in migraines but noted worsening over the past 2 weeks. She continues verapamil  40mg  (reduced recently from 80 per PCP), magnesium supplements and Ubrelvy  as needed. She is planning for pregnancy in the next year and has stopped Ajovy.     Consent Form Botulism Toxin Injection For Chronic Migraine    Reviewed orally with patient, additionally signature is on file:  Botulism toxin has been approved by the Federal drug administration for treatment of chronic migraine. Botulism toxin does not cure chronic migraine and it may not be effective in some patients.  The administration of botulism toxin is accomplished by injecting a small amount of toxin into the muscles of the neck and head. Dosage must be titrated for each individual. Any benefits resulting from botulism toxin tend to wear off after 3 months with a repeat injection required if benefit is to be maintained. Injections are usually done every 3-4 months with maximum effect peak achieved by about 2 or 3 weeks. Botulism toxin is expensive and you should be sure of what costs you will incur resulting from the injection.  The side effects of botulism toxin use for chronic migraine may include:   -Transient, and usually mild, facial weakness with facial injections  -Transient, and usually mild, head or neck weakness with head/neck injections  -Reduction or loss of forehead facial animation due to forehead muscle weakness  -Eyelid drooping  -Dry eye  -Pain at the site of injection or bruising at the site of  injection  -Double vision  -Potential unknown long term risks   Contraindications: You should not have Botox  if you are pregnant, nursing, allergic to albumin, have an infection, skin condition, or muscle weakness at the site of the injection, or have myasthenia gravis, Lambert-Eaton syndrome, or ALS.  It is also possible that as with any injection, there may be an allergic reaction or no effect from the medication. Reduced effectiveness after repeated injections is sometimes seen and rarely infection at the injection site may occur. All care will be taken to prevent these side effects. If therapy is given over a long time, atrophy and wasting in the muscle injected may occur. Occasionally the patient's become refractory to treatment because they develop antibodies to the toxin. In this event, therapy needs to be modified.  I have read the above information and consent to the administration of botulism toxin.    BOTOX  PROCEDURE NOTE FOR MIGRAINE HEADACHE  Contraindications and precautions discussed with patient(above). Aseptic procedure was observed and patient tolerated procedure. Procedure performed by Greig Forbes, FNP-C.   The condition has existed for more than 6 months, and pt does not have a diagnosis of ALS, Myasthenia Gravis or Lambert-Eaton Syndrome.  Risks and benefits of injections discussed and pt agrees to proceed with the procedure.  Written consent obtained  These injections are medically necessary. Pt  receives good benefits from these injections. These injections do not cause sedations or hallucinations which the oral therapies may cause.   Description of procedure:  The patient was placed in a sitting position. The standard protocol was used for Botox   as follows, with 5 units of Botox  injected at each site:  -Procerus muscle, midline injection  -Corrugator muscle, bilateral injection  -Frontalis muscle, bilateral injection, with 2 sites each side, medial injection was  performed in the upper one third of the frontalis muscle, in the region vertical from the medial inferior edge of the superior orbital rim. The lateral injection was again in the upper one third of the forehead vertically above the lateral limbus of the cornea, 1.5 cm lateral to the medial injection site.  -Temporalis muscle injection, 4 sites, bilaterally. The first injection was 3 cm above the tragus of the ear, second injection site was 1.5 cm to 3 cm up from the first injection site in line with the tragus of the ear. The third injection site was 1.5-3 cm forward between the first 2 injection sites. The fourth injection site was 1.5 cm posterior to the second injection site. 5th site laterally in the temporalis  muscleat the level of the outer canthus.  -Occipitalis muscle injection, 3 sites, bilaterally. The first injection was done one half way between the occipital protuberance and the tip of the mastoid process behind the ear. The second injection site was done lateral and superior to the first, 1 fingerbreadth from the first injection. The third injection site was 1 fingerbreadth superiorly and medially from the first injection site.  -Cervical paraspinal muscle injection, 2 sites, bilaterally. The first injection site was 1 cm from the midline of the cervical spine, 3 cm inferior to the lower border of the occipital protuberance. The second injection site was 1.5 cm superiorly and laterally to the first injection site.  -Trapezius muscle injection was performed at 3 sites, bilaterally. The first injection site was in the upper trapezius muscle halfway between the inflection point of the neck, and the acromion. The second injection site was one half way between the acromion and the first injection site. The third injection was done between the first injection site and the inflection point of the neck.   Will return for repeat injection in 3 months.   A total of 200 units of Botox  was prepared,  155 units of Botox  was injected as documented above, any Botox  not injected was wasted. The patient tolerated the procedure well, there were no complications of the above procedure.

## 2024-04-09 NOTE — Progress Notes (Signed)
 Botox - 200 units x 1 vial Lot: [id number] Expiration: 2028/03 NDC: 0023-3921-02  Bacteriostatic 0.9% Sodium Chloride- 4 mL  Lot: [id number]  Expiration: 02/23/25 NDC: [id number]  Dx: H56.290  S/P  Witnessed by BUDDY MA (TRAINING) AND DELON ORN RMA

## 2024-04-23 ENCOUNTER — Encounter: Payer: Self-pay | Admitting: Family Medicine

## 2024-04-23 ENCOUNTER — Ambulatory Visit: Admitting: Family Medicine

## 2024-04-23 VITALS — BP 118/72 | HR 81 | Temp 98.0°F | Wt 143.0 lb

## 2024-04-23 DIAGNOSIS — G43709 Chronic migraine without aura, not intractable, without status migrainosus: Secondary | ICD-10-CM | POA: Diagnosis not present

## 2024-04-23 DIAGNOSIS — R051 Acute cough: Secondary | ICD-10-CM

## 2024-04-23 DIAGNOSIS — Z23 Encounter for immunization: Secondary | ICD-10-CM | POA: Diagnosis not present

## 2024-04-23 DIAGNOSIS — Z7689 Persons encountering health services in other specified circumstances: Secondary | ICD-10-CM | POA: Diagnosis not present

## 2024-04-23 DIAGNOSIS — J069 Acute upper respiratory infection, unspecified: Secondary | ICD-10-CM

## 2024-04-23 DIAGNOSIS — Z1283 Encounter for screening for malignant neoplasm of skin: Secondary | ICD-10-CM | POA: Diagnosis not present

## 2024-04-23 LAB — POCT INFLUENZA A/B
Influenza A, POC: NEGATIVE
Influenza B, POC: NEGATIVE

## 2024-04-23 LAB — POC COVID19 BINAXNOW: SARS Coronavirus 2 Ag: NEGATIVE

## 2024-04-23 MED ORDER — COVID-19 MRNA VACC (MODERNA) 50 MCG/0.5ML IM SUSP: 0.5000 mL | Freq: Once | INTRAMUSCULAR | 0 refills | Status: AC

## 2024-04-23 NOTE — Progress Notes (Unsigned)
 "  New Patient Visit  Subjective:     Patient ID: Ashlee Grant, female    DOB: March 02, 1991, 33 y.o.   MRN: [id number]  No chief complaint on file.   HPI  Discussed the use of AI scribe software for clinical note transcription with the patient, who gave verbal consent to proceed.  History of Present Illness Ashlee Grant is a 33 year old female who presents with symptoms of an upper respiratory infection.  Upper respiratory symptoms - Acute onset of symptoms this morning following exposure to a sick child at a Christmas gathering - Dry cough began last night - Marked dryness - Mild chills occurred yesterday evening, currently no chills or body aches - Concerned about influenza - Received influenza vaccination in October - Unable to obtain COVID-19 vaccine despite attempting for approximately one year  Migraine - Migraine present for 4 days - Ubrelvy  taken daily with only partial relief - Received Botox  injection for migraine prophylaxis last week; typically requires time for therapeutic effect  Dermatologic concerns - Considering switching dermatology care to a closer location for management of hormonal acne and routine skin checks     ROS Per HPI  Outpatient Encounter Medications as of 04/23/2024  Medication Sig   Atogepant  (QULIPTA ) 60 MG TABS Take 1 tablet (60 mg total) by mouth daily. Please run copay card: BIN [id number] PCN [id number] ID [id number] EXP 04/25/2024   botulinum toxin Type A  (BOTOX ) 200 units injection Provider to inject 155 units into the muscles of the head and neck every 12 weeks. Discard remainder.   cyanocobalamin ([facility] VITAMIN B12) 1000 MCG tablet Take 1 tablet by mouth every other day.   hydrocortisone 2.5 % cream Apply topically twice daily x 2 weeks, then as needed.   hydrOXYzine  (ATARAX ) 10 MG tablet Take 1 tablet (10 mg total) by mouth 3 (three) times daily as needed.   Magnesium Oxide 250 MG TABS Take by mouth.    Multiple Vitamin (MULTIVITAMIN) capsule Take 1 capsule by mouth daily.   ondansetron  (ZOFRAN -ODT) 8 MG disintegrating tablet Take 1 tablet (8 mg total) by mouth every 8 (eight) hours as needed.   spironolactone  (ALDACTONE ) 25 MG tablet Take 1 tablet (25 mg total) by mouth daily.   Ubrogepant  (UBRELVY ) 100 MG TABS Take 1 tablet (100 mg total) by mouth every 2 (two) hours as needed. Maximum 200mg  a day.   verapamil  (CALAN ) 40 MG tablet Take 1 tablet (40 mg total) by mouth daily.   No facility-administered encounter medications on file as of 04/23/2024.    Past Medical History:  Diagnosis Date   Migraine     Past Surgical History:  Procedure Laterality Date   TONSILLECTOMY      Family History  Problem Relation Age of Onset   Seizures Mother    Stroke Paternal Aunt    Stroke Paternal Uncle    Stroke Maternal Grandmother    Stroke Maternal Grandfather    Migraines Neg Hx     Social History   Socioeconomic History   Marital status: Married    Spouse name: Not on file   Number of children: Not on file   Years of education: Not on file   Highest education level: Bachelor's degree (e.g., BA, AB, BS)  Occupational History   Not on file  Tobacco Use   Smoking status: Never   Smokeless tobacco: Never  Vaping Use   Vaping status: Never Used  Substance and Sexual Activity  Alcohol use: Not Currently   Drug use: Never   Sexual activity: Yes  Other Topics Concern   Not on file  Social History Narrative   Not on file   Social Drivers of Health   Tobacco Use: Low Risk (01/04/2024)   Patient History    Smoking Tobacco Use: Never    Smokeless Tobacco Use: Never    Passive Exposure: Not on file  Financial Resource Strain: Low Risk (04/22/2024)   Overall Financial Resource Strain (CARDIA)    Difficulty of Paying Living Expenses: Not hard at all  Food Insecurity: No Food Insecurity (04/22/2024)   Epic    Worried About Radiation Protection Practitioner of Food in the Last Year: Never true    Ran  Out of Food in the Last Year: Never true  Transportation Needs: No Transportation Needs (04/22/2024)   Epic    Lack of Transportation (Medical): No    Lack of Transportation (Non-Medical): No  Physical Activity: Sufficiently Active (04/22/2024)   Exercise Vital Sign    Days of Exercise per Week: 4 days    Minutes of Exercise per Session: 40 min  Stress: No Stress Concern Present (04/22/2024)   [employer] of Occupational Health - Occupational Stress Questionnaire    Feeling of Stress: Only a little  Social Connections: Socially Integrated (04/22/2024)   Social Connection and Isolation Panel    Frequency of Communication with Friends and Family: More than three times a week    Frequency of Social Gatherings with Friends and Family: Once a week    Attends Religious Services: More than 4 times per year    Active Member of [employer] or Organizations: Yes    Attends Engineer, Structural: More than 4 times per year    Marital Status: Married  Catering Manager Violence: Not At Risk (12/01/2022)   Received from [facility]   HITS    Over the last 12 months how often did your partner physically hurt you?: Never    Over the last 12 months how often did your partner insult you or talk down to you?: Never    Over the last 12 months how often did your partner threaten you with physical harm?: Never    Over the last 12 months how often did your partner scream or curse at you?: Never  Depression (PHQ2-9): Low Risk (04/23/2024)   Depression (PHQ2-9)    PHQ-2 Score: 3  Alcohol Screen: Low Risk (04/22/2024)   Alcohol Screen    Last Alcohol Screening Score (AUDIT): 1  Housing: Low Risk (04/22/2024)   Epic    Unable to Pay for Housing in the Last Year: No    Number of Times Moved in the Last Year: 0    Homeless in the Last Year: No  Utilities: Not At Risk (12/01/2022)   Received from [facility] Utilities    Threatened with loss of utilities: No  Health Literacy: Not on file        Objective:    BP 118/72 (BP Location: Left Arm, Patient Position: Sitting)   Pulse 81   Temp 98 F (36.7 C) (Temporal)   Wt 143 lb (64.9 kg)   SpO2 98%   BMI 25.33 kg/m    Physical Exam Vitals and nursing note reviewed.  Constitutional:      General: She is not in acute distress.    Appearance: Normal appearance. She is normal weight.  HENT:     Head: Normocephalic and atraumatic.  Right Ear: External ear normal.     Left Ear: External ear normal.     Nose: Nose normal.     Mouth/Throat:     Mouth: Mucous membranes are moist.     Pharynx: Oropharynx is clear.  Eyes:     Extraocular Movements: Extraocular movements intact.     Pupils: Pupils are equal, round, and reactive to light.  Cardiovascular:     Rate and Rhythm: Normal rate and regular rhythm.     Pulses: Normal pulses.     Heart sounds: Normal heart sounds.  Pulmonary:     Effort: Pulmonary effort is normal. No respiratory distress.     Breath sounds: Normal breath sounds. No wheezing, rhonchi or rales.  Musculoskeletal:        General: Normal range of motion.     Cervical back: Normal range of motion.     Right lower leg: No edema.     Left lower leg: No edema.  Lymphadenopathy:     Cervical: No cervical adenopathy.  Neurological:     General: No focal deficit present.     Mental Status: She is alert and oriented to person, place, and time.  Psychiatric:        Mood and Affect: Mood normal.        Thought Content: Thought content normal.     No results found for any visits on 04/23/24.      Assessment & Plan:   Assessment and Plan Assessment & Plan Acute upper respiratory infection Symptoms include dry cough, fatigue, and mild chills. Differential diagnosis includes COVID-19 and influenza. - Ordered COVID-19 and influenza tests. - Provided a mask for protection.  Chronic migraine Chronic migraines with a recent episode lasting four days. Ubrelvy  typically effective but requires  frequent use due to cluster pattern. Recent Botox  treatment may take time to show effects. - Continue Ubrelvy  as needed for migraine relief.  General Health Maintenance Discussed importance of COVID-19 vaccination, especially with chronic conditions like migraines. - Sent prescription for COVID-19 vaccine. - Referred to dermatology for skin checks and management of hormonal acne.     No orders of the defined types were placed in this encounter.    No orders of the defined types were placed in this encounter.   No follow-ups on file.  Corean LITTIE Ku, FNP   "

## 2024-04-23 NOTE — Patient Instructions (Addendum)
 Welcome to [facility]!  Thank you for choosing us  for your Primary Care needs.   We offer in person and video appointments for your convenience. You may call our office to schedule appointments, or you may schedule appointments with me through MyChart.   The best way to get in contact with me is via MyChart message. This will get to me faster than a phone call, unless there is an emergency, then please call 911.  The lab is located downstairs in the Sports Medicine building, we also have xray available there.   We have sent in a referral to dermatology for you today. Someone should be reaching out to get you scheduled for this.  Follow up with dermatology as scheduled.  COVID and flu testing were negative today.  Follow up with me in about 6 months for physical, sooner if needed.

## 2024-04-30 ENCOUNTER — Encounter: Payer: Self-pay | Admitting: Family Medicine

## 2024-06-19 ENCOUNTER — Ambulatory Visit: Admitting: Neurology

## 2024-07-02 ENCOUNTER — Ambulatory Visit: Admitting: Neurology

## 2024-07-02 ENCOUNTER — Ambulatory Visit: Admitting: Family Medicine

## 2024-10-22 ENCOUNTER — Ambulatory Visit: Admitting: Family Medicine

## 2024-12-26 ENCOUNTER — Ambulatory Visit: Admitting: Physician Assistant
